# Patient Record
Sex: Female | Born: 1975 | Race: Black or African American | Hispanic: No | Marital: Single | Smoking: Current every day smoker
Health system: Southern US, Community
[De-identification: ages and names within clinical notes are randomized; demographics above are authoritative.]

## PROBLEM LIST (undated history)

## (undated) DIAGNOSIS — J45909 Unspecified asthma, uncomplicated: Secondary | ICD-10-CM

---

## 2016-05-23 ENCOUNTER — Emergency Department (HOSPITAL_COMMUNITY)
Admission: EM | Admit: 2016-05-23 | Discharge: 2016-05-23 | Disposition: A | Discharge status: Home / Self Care | Payer: Medicaid Other | Attending: Emergency Medicine | Admitting: Emergency Medicine

## 2016-05-23 ENCOUNTER — Encounter (HOSPITAL_COMMUNITY): Payer: Self-pay | Admitting: Emergency Medicine

## 2016-05-23 DIAGNOSIS — R062 Wheezing: Secondary | ICD-10-CM | POA: Diagnosis present

## 2016-05-23 DIAGNOSIS — J988 Other specified respiratory disorders: Secondary | ICD-10-CM | POA: Diagnosis not present

## 2016-05-23 DIAGNOSIS — J45909 Unspecified asthma, uncomplicated: Secondary | ICD-10-CM | POA: Diagnosis not present

## 2016-05-23 HISTORY — DX: Unspecified asthma, uncomplicated: J45.909

## 2016-05-23 LAB — RAPID STREP SCREEN (MED CTR MEBANE ONLY): Streptococcus, Group A Screen (Direct): NEGATIVE

## 2016-05-23 MED ORDER — NAPROXEN 500 MG PO TABS: 500.0000 mg | ORAL_TABLET | Freq: Two times a day (BID) | ORAL | 0 refills | Status: DC

## 2016-05-23 MED ORDER — BENZONATATE 100 MG PO CAPS: 100.0000 mg | ORAL_CAPSULE | Freq: Three times a day (TID) | ORAL | 0 refills | Status: DC

## 2016-05-23 NOTE — ED Provider Notes (Signed)
[facility]-EMER[facility]ENCY DEPT Provider Note   CSN: [id number] Arrival date & time: 05/23/16  1255     History   Chief Complaint Chief Complaint  Patient presents with  . Influenza    HPI Alexandra Weber is a 41 y.o. female.  Patient presents with four-day history of cough, scratchy throat, chills. She has history of asthma but has not needed her inhaler. She noted some wheezing yesterday, none today. No associated fevers, vomiting, or diarrhea. Her daughter was recently sick with strep throat. No urinary tract infection symptoms. Patient has been treating at home with TheraFlu without relief. Also ibuprofen. Onset of symptoms acute. Course is constant. Nothing makes symptoms worse.       Past Medical History:  Diagnosis Date  . Asthma     There are no active problems to display for this patient.   History reviewed. No pertinent surgical history.  OB History    No data available       Home Medications    Prior to Admission medications   Not on File    Family History No family history on file.  Social History Social History  Substance Use Topics  . Smoking status: Never Smoker  . Smokeless tobacco: Never Used  . Alcohol use Not on file     Allergies   Patient has no allergy information on record.   Review of Systems Review of Systems  Constitutional: Positive for chills. Negative for fatigue and fever.  HENT: Positive for congestion and sore throat. Negative for ear pain, rhinorrhea and sinus pressure.   Eyes: Negative for redness.  Respiratory: Positive for cough and wheezing.   Gastrointestinal: Negative for abdominal pain, diarrhea, nausea and vomiting.  Genitourinary: Negative for dysuria.  Musculoskeletal: Negative for myalgias and neck stiffness.  Skin: Negative for rash.  Neurological: Negative for headaches.  Hematological: Negative for adenopathy.     Physical Exam Updated Vital Signs BP (!) 123/93 (BP Location: Right Arm)   Pulse 89   Temp  98.4 F (36.9 C) (Oral)   Resp 16   SpO2 100%   Physical Exam  Constitutional: She appears well-developed and well-nourished.  HENT:  Head: Normocephalic and atraumatic.  Right Ear: Tympanic membrane, external ear and ear canal normal.  Left Ear: Tympanic membrane, external ear and ear canal normal.  Nose: Mucosal edema present. No rhinorrhea.  Mouth/Throat: Uvula is midline and mucous membranes are normal. Mucous membranes are not dry. No oral lesions. No trismus in the jaw. No uvula swelling. Posterior oropharyngeal erythema present. No oropharyngeal exudate, posterior oropharyngeal edema or tonsillar abscesses.  Eyes: Conjunctivae are normal. Right eye exhibits no discharge. Left eye exhibits no discharge.  Neck: Normal range of motion. Neck supple.  Cardiovascular: Normal rate, regular rhythm and normal heart sounds.   Pulmonary/Chest: Effort normal and breath sounds normal. No respiratory distress. She has no wheezes. She has no rales.  Abdominal: Soft. There is no tenderness.  Lymphadenopathy:    She has no cervical adenopathy.  Neurological: She is alert.  Skin: Skin is warm and dry.  Psychiatric: She has a normal mood and affect.  Nursing note and vitals reviewed.    ED Treatments / Results  Labs (all labs ordered are listed, but only abnormal results are displayed) Labs Reviewed  RAPID STREP [facility]CREEN (NOT [facility])  CULTURE, GROUP A STREP [facility])    Procedures Procedures (including critical care time)  Medications Ordered in ED Medications - No data to display   Initial Impression / Assessment  and Plan / ED Course  I have reviewed the triage vital signs and the nursing notes.  Pertinent labs & imaging results that were available during my care of the patient were reviewed by me and considered in my medical decision making (see chart for details).     Patient seen and examined. Plan strep test. If negative discharge home with symptomatic control.  Vital signs  reviewed and are as follows: BP (!) 123/93 (BP Location: Right Arm)   Pulse 89   Temp 98.4 F (36.9 C) (Oral)   Resp 16   SpO2 100%   2:19 PM Patient counseled on supportive care for viral URI and s/s to return including worsening symptoms, persistent fever, persistent vomiting, or if they have any other concerns. Urged to see PCP if symptoms persist for more than 3 days. Patient verbalizes understanding and agrees with plan.    Final Clinical Impressions(s) / ED Diagnoses   Final diagnoses:  Respiratory infection   Patient with symptoms consistent with a viral syndrome. Strep neg. Vitals are stable, no fever. No signs of dehydration. Lung exam normal, no signs of pneumonia. Supportive therapy indicated with return if symptoms worsen.     New Prescriptions Current Discharge Medication List    START taking these medications   Details  benzonatate (TESSALON) 100 MG capsule Take 1 capsule (100 mg total) by mouth every 8 (eight) hours. Qty: 15 capsule, Refills: 0    naproxen (NAPROSYN) 500 MG tablet Take 1 tablet (500 mg total) by mouth 2 (two) times daily. Qty: 20 tablet, Refills: 0         Renne Crigler, PA-C 05/23/16 1419    Canary Brim Tegeler, MD 05/24/16 1101

## 2016-05-23 NOTE — ED Triage Notes (Signed)
Pt states her daughter had strep last week and on Friday she started to feel bad and her she had chills, states she has a cough and  Her throat feels bad, itchy

## 2016-05-23 NOTE — Discharge Instructions (Signed)
Please read and follow all provided instructions.  Your diagnoses today include:  1. Respiratory infection     You appear to have an upper respiratory infection (URI). An upper respiratory tract infection, or cold, is a viral infection of the air passages leading to the lungs. It should improve gradually after 5-7 days. You may have a lingering cough that lasts for 2- 4 weeks after the infection.  Tests performed today include:  Vital signs. See below for your results today.   Strep test -  negative   Medications prescribed:   Tessalon Perles - cough suppressant medication   Naproxen - anti-inflammatory pain medication  Do not exceed 500mg  naproxen every 12 hours, take with food  You have been prescribed an anti-inflammatory medication or NSAID. Take with food. Take smallest effective dose for the shortest duration needed for your pain. Stop taking if you experience stomach pain or vomiting.   Take any prescribed medications only as directed. Treatment for your infection is aimed at treating the symptoms. There are no medications, such as antibiotics, that will cure your infection.   Home care instructions:  Follow any educational materials contained in this packet.   Your illness is contagious and can be spread to others, especially during the first 3 or 4 days. It cannot be cured by antibiotics or other medicines. Take basic precautions such as washing your hands often, covering your mouth when you cough or sneeze, and avoiding public places where you could spread your illness to others.   Please continue drinking plenty of fluids.  Use over-the-counter medicines as needed as directed on packaging for symptom relief.  You may also use ibuprofen or tylenol as directed on packaging for pain or fever.  Do not take multiple medicines containing Tylenol or acetaminophen to avoid taking too much of this medication.  Follow-up instructions: Please follow-up with your primary care  provider in the next 3 days for further evaluation of your symptoms if you are not feeling better.   Return instructions:   Please return to the Emergency Department if you experience worsening symptoms.   RETURN IMMEDIATELY IF you develop shortness of breath, confusion or altered mental status, a new rash, become dizzy, faint, or poorly responsive, or are unable to be cared for at home.  Please return if you have persistent vomiting and cannot keep down fluids or develop a fever that is not controlled by tylenol or motrin.    Please return if you have any other emergent concerns.  Additional Information:  Your vital signs today were: BP (!) 123/93 (BP Location: Right Arm)    Pulse 89    Temp 98.4 F (36.9 C) (Oral)    Resp 16    SpO2 100%  If your blood pressure (BP) was elevated above 135/85 this visit, please have this repeated by your doctor within one month. --------------

## 2016-05-23 NOTE — ED Notes (Signed)
Pt states he daughter had strep last week and Friday she started to feel bad, started  To have sorethroat on Friday and a cough today , it makes her chest hurt

## 2016-05-25 LAB — CULTURE, GROUP A STREP (THRC)

## 2016-06-25 ENCOUNTER — Emergency Department (HOSPITAL_COMMUNITY)
Admission: EM | Admit: 2016-06-25 | Discharge: 2016-06-25 | Disposition: A | Discharge status: Home / Self Care | Payer: Medicaid Other | Attending: Emergency Medicine | Admitting: Emergency Medicine

## 2016-06-25 ENCOUNTER — Encounter (HOSPITAL_COMMUNITY): Payer: Self-pay | Admitting: Emergency Medicine

## 2016-06-25 DIAGNOSIS — Y92003 Bedroom of unspecified non-institutional (private) residence as the place of occurrence of the external cause: Secondary | ICD-10-CM | POA: Diagnosis not present

## 2016-06-25 DIAGNOSIS — M25461 Effusion, right knee: Secondary | ICD-10-CM | POA: Insufficient documentation

## 2016-06-25 DIAGNOSIS — S8391XA Sprain of unspecified site of right knee, initial encounter: Secondary | ICD-10-CM | POA: Diagnosis not present

## 2016-06-25 DIAGNOSIS — Y999 Unspecified external cause status: Secondary | ICD-10-CM | POA: Insufficient documentation

## 2016-06-25 DIAGNOSIS — J45909 Unspecified asthma, uncomplicated: Secondary | ICD-10-CM | POA: Insufficient documentation

## 2016-06-25 DIAGNOSIS — F17210 Nicotine dependence, cigarettes, uncomplicated: Secondary | ICD-10-CM | POA: Diagnosis not present

## 2016-06-25 DIAGNOSIS — Y939 Activity, unspecified: Secondary | ICD-10-CM | POA: Diagnosis not present

## 2016-06-25 DIAGNOSIS — S8991XA Unspecified injury of right lower leg, initial encounter: Secondary | ICD-10-CM | POA: Diagnosis present

## 2016-06-25 DIAGNOSIS — W1830XA Fall on same level, unspecified, initial encounter: Secondary | ICD-10-CM | POA: Insufficient documentation

## 2016-06-25 MED ORDER — NAPROXEN 500 MG PO TABS: 500.0000 mg | ORAL_TABLET | Freq: Two times a day (BID) | ORAL | 0 refills | Status: DC

## 2016-06-25 NOTE — Discharge Instructions (Signed)
Please see the Orthopedist as requested for optimal evaluation and care of your knee injury.

## 2016-06-25 NOTE — ED Notes (Signed)
Ortho tech in. 

## 2016-06-25 NOTE — Progress Notes (Signed)
Orthopedic Tech Progress Note Patient Details:  Alexandra Weber 04/20/1975 [id number]  Ortho Devices Type of Ortho Device: Knee Immobilizer, Crutches Ortho Device/Splint Location: RLE Ortho Device/Splint Interventions: Ordered, Application   [name] 06/25/2016, 3:24 PM

## 2016-06-25 NOTE — ED Notes (Signed)
Ortho tech notified.  

## 2016-06-25 NOTE — ED Provider Notes (Signed)
[facility] DEPT Provider Note    By signing my name below, I, Earmon Phoenix, attest that this documentation has been prepared under the direction and in the presence of Derwood Kaplan, MD. Electronically Signed: Earmon Phoenix, ED Scribe. 06/25/16. 3:03 PM.    History   Chief Complaint Chief Complaint  Patient presents with  . Knee Pain    The history is provided by the patient and medical records. No language interpreter was used.    Alexandra Weber is a 41 y.o. female brought in by EMS who presents to the Emergency Department complaining of throbbing right knee pain that began two days ago. She reports associated worsening swelling. Pt reports hearing a pop when the pain began. She states she was playing around and twisted the knee. She has taken Ibuprofen, elevating and icing the area for pain with minimal relief. Bearing weight and moving the right ankle her intensifies her pain. She denies alleviating factors. She denies numbness, tingling or weakness of the RLE, bruising or wounds.   Past Medical History:  Diagnosis Date  . Asthma     There are no active problems to display for this patient.   History reviewed. No pertinent surgical history.  OB History    No data available       Home Medications    Prior to Admission medications   Medication Sig Start Date End Date Taking? Authorizing Provider  benzonatate (TESSALON) 100 MG capsule Take 1 capsule (100 mg total) by mouth every 8 (eight) hours. 05/23/16   Renne Crigler, PA-C  naproxen (NAPROSYN) 500 MG tablet Take 1 tablet (500 mg total) by mouth 2 (two) times daily. 06/25/16   Derwood Kaplan, MD    Family History No family history on file.  Social History Social History  Substance Use Topics  . Smoking status: Current Every Day Smoker  . Smokeless tobacco: Current User  . Alcohol use No     Allergies   Patient has no allergy information on record.   Review of Systems Review of Systems    Musculoskeletal: Positive for arthralgias and joint swelling.  Skin: Negative for color change and wound.  Neurological: Negative for weakness and numbness.     Physical Exam Updated Vital Signs BP (!) 156/92 (BP Location: Left Arm)   Pulse 92   Temp 97.9 F (36.6 C) (Oral)   Resp 12   Ht  (1.626 m)   Wt 110 lb (49.9 kg)   LMP 06/21/2016   SpO2 100%   BMI 18.88 kg/m   Physical Exam  Constitutional: She is oriented to person, place, and time. She appears well-developed and well-nourished.  HENT:  Head: Normocephalic.  Eyes: EOM are normal.  Neck: Normal range of motion.  Pulmonary/Chest: Effort normal.  Abdominal: She exhibits no distension.  Musculoskeletal: She exhibits tenderness.  Right knee with gross effusion appreciated to lateral and medial aspect. Pt is having difficulty extending the right leg secondary to pain. Tenderness to palpation over distal quadricept tendon, medial and lateral knee. No laxity to posterior and anterior drawers. Pt has tenderness with both valgus and varus movement.  Neurological: She is alert and oriented to person, place, and time.  Psychiatric: She has a normal mood and affect.  Nursing note and vitals reviewed.    ED Treatments / Results  DIAGNOSTIC STUDIES: Oxygen Saturation is 100% on RA, normal by my interpretation.   COORDINATION OF CARE: 2:59 PM- Will order crutches and a knee immobilizer. Will give referral to orthopedist.  Pt verbalizes understanding and agrees to plan.  Medications - No data to display  Labs (all labs ordered are listed, but only abnormal results are displayed) Labs Reviewed - No data to display  EKG  EKG Interpretation None       Radiology No results found.  Procedures Procedures (including critical care time)  Medications Ordered in ED Medications - No data to display   Initial Impression / Assessment and Plan / ED Course  I have reviewed the triage vital signs and the nursing  notes.  Pertinent labs & imaging results that were available during my care of the patient were reviewed by me and considered in my medical decision making (see chart for details).     Pt likely has soft tissue injury of the knee / knee sprain. Knee is stable. We will d/c with partial weight bearing knee immobilizer and ortho f/u.  Final Clinical Impressions(s) / ED Diagnoses   Final diagnoses:  Effusion of right knee  Sprain of right knee, unspecified ligament, initial encounter    New Prescriptions Discharge Medication List as of 06/25/2016  3:28 PM      I personally performed the services described in this documentation, which was scribed in my presence. The recorded information has been reviewed and is accurate.     Derwood Kaplan, MD 06/25/16 1640

## 2016-06-25 NOTE — ED Notes (Signed)
Twisted right knee 2 days ago. Has been treating with Ibuprofen, Tylenol and ice without relief.

## 2016-06-25 NOTE — ED Triage Notes (Signed)
Pt. stated I was in the bedroom and fell, and hurt my knees.

## 2016-10-19 ENCOUNTER — Emergency Department (HOSPITAL_COMMUNITY)
Admission: EM | Admit: 2016-10-19 | Discharge: 2016-10-19 | Disposition: A | Discharge status: Home / Self Care | Payer: Medicaid Other | Attending: Emergency Medicine | Admitting: Emergency Medicine

## 2016-10-19 ENCOUNTER — Emergency Department (HOSPITAL_COMMUNITY): Payer: Medicaid Other

## 2016-10-19 ENCOUNTER — Encounter (HOSPITAL_COMMUNITY): Payer: Self-pay | Admitting: Emergency Medicine

## 2016-10-19 DIAGNOSIS — J45909 Unspecified asthma, uncomplicated: Secondary | ICD-10-CM | POA: Insufficient documentation

## 2016-10-19 DIAGNOSIS — R0789 Other chest pain: Secondary | ICD-10-CM | POA: Insufficient documentation

## 2016-10-19 DIAGNOSIS — R918 Other nonspecific abnormal finding of lung field: Secondary | ICD-10-CM | POA: Insufficient documentation

## 2016-10-19 DIAGNOSIS — R072 Precordial pain: Secondary | ICD-10-CM

## 2016-10-19 DIAGNOSIS — F17200 Nicotine dependence, unspecified, uncomplicated: Secondary | ICD-10-CM | POA: Diagnosis not present

## 2016-10-19 LAB — BASIC METABOLIC PANEL
ANION GAP: 6 (ref 5–15)
BUN: 8 mg/dL (ref 6–20)
CO2: 25 mmol/L (ref 22–32)
CREATININE: 0.84 mg/dL (ref 0.44–1.00)
Calcium: 8.9 mg/dL (ref 8.9–10.3)
Chloride: 107 mmol/L (ref 101–111)
Glucose, Bld: 80 mg/dL (ref 65–99)
Potassium: 3.9 mmol/L (ref 3.5–5.1)
SODIUM: 138 mmol/L (ref 135–145)

## 2016-10-19 LAB — POCT I-STAT TROPONIN I: TROPONIN I, POC: 0 ng/mL (ref 0.00–0.08)

## 2016-10-19 LAB — CBC
HCT: 34.6 % — ABNORMAL LOW (ref 36.0–46.0)
HEMOGLOBIN: 11.4 g/dL — AB (ref 12.0–15.0)
MCH: 30.2 pg (ref 26.0–34.0)
MCHC: 32.9 g/dL (ref 30.0–36.0)
MCV: 91.5 fL (ref 78.0–100.0)
Platelets: 189 10*3/uL (ref 150–400)
RBC: 3.78 MIL/uL — AB (ref 3.87–5.11)
RDW: 13.7 % (ref 11.5–15.5)
WBC: 8.1 10*3/uL (ref 4.0–10.5)

## 2016-10-19 MED ORDER — AZITHROMYCIN 250 MG PO TABS: 250.0000 mg | ORAL_TABLET | Freq: Every day | ORAL | 0 refills | Status: DC

## 2016-10-19 MED ORDER — IOPAMIDOL (ISOVUE-300) INJECTION 61%
INTRAVENOUS | Status: AC
  Administered 2016-10-19: 75 mL
  Filled 2016-10-19: qty 75

## 2016-10-19 NOTE — ED Notes (Signed)
Pt had drawn in triage for labs:  Gold Lavender  Lt green  Dark green

## 2016-10-19 NOTE — ED Notes (Addendum)
Patient transported to CT 

## 2016-10-19 NOTE — ED Provider Notes (Signed)
Emergency Department Provider Note   I have reviewed the triage vital signs and the nursing notes.   HISTORY  Chief Complaint Chest Pain   HPI Alexandra Weber is a 41 y.o. female with PMH of asthma presents to the emergency department for evaluation of left-sided chest pain just since radiated to the right side over the past 2 days. No modifying factors. Patient is a smoker and quit 2 days ago when symptoms began. Denies any fevers or chills. In addition to her chest pain symptoms she's noticed some clear, bilateral nipple discharge is very unusual for her. She is tried over-the-counter medications with little relief in symptoms.   Past Medical History:  Diagnosis Date  . Asthma     There are no active problems to display for this patient.   History reviewed. No pertinent surgical history.  Current Outpatient Rx  . Order #: [id number] Class: Historical Med  . Order #: [id number] Class: Historical Med  . Order #: [id number] Class: Historical Med  . Order #: [id number] Class: Print  . Order #: [id number] Class: Print  . Order #: [id number] Class: Print  . Order #: [id number] Class: Historical Med    Allergies Patient has no known allergies.  History reviewed. No pertinent family history.  Social History Social History  Substance Use Topics  . Smoking status: Current Every Day Smoker  . Smokeless tobacco: Current User  . Alcohol use No    Review of Systems  Constitutional: No fever/chills Eyes: No visual changes. ENT: No sore throat. Cardiovascular: Positive chest pain. Respiratory: Denies shortness of breath. Gastrointestinal: No abdominal pain.  No nausea, no vomiting.  No diarrhea.  No constipation. Genitourinary: Negative for dysuria. Musculoskeletal: Negative for back pain. Skin: Negative for rash. Neurological: Negative for headaches, focal weakness or numbness.  10-point ROS otherwise negative.  ____________________________________________   PHYSICAL  EXAM:  VITAL SIGNS: ED Triage Vitals  Enc Vitals Group     BP 10/19/16 1216 (!) 141/93     Pulse Rate 10/19/16 1216 78     Resp 10/19/16 1216 18     Temp 10/19/16 1216 98 F (36.7 C)     Temp Source 10/19/16 1216 Oral     SpO2 10/19/16 1216 100 %     Pain Score 10/19/16 1212 8    Constitutional: Alert and oriented. Well appearing and in no acute distress. Eyes: Conjunctivae are normal. Head: Atraumatic. Nose: No congestion/rhinnorhea. Mouth/Throat: Mucous membranes are moist.  Oropharynx non-erythematous. Neck: No stridor. No meningismus. Palpable, tender posterior cervical lymph node on the right. No abscess or cellulitis.  Cardiovascular: Normal rate, regular rhythm. Good peripheral circulation. Grossly normal heart sounds.   Respiratory: Normal respiratory effort.  No retractions. Lungs CTAB. Gastrointestinal: Soft and nontender. No distention.  Musculoskeletal: No lower extremity tenderness nor edema. No gross deformities of extremities. Neurologic:  Normal speech and language. No gross focal neurologic deficits are appreciated.  Skin:  Skin is warm, dry and intact. No rash noted.  ____________________________________________   LABS (all labs ordered are listed, but only abnormal results are displayed)  Labs Reviewed  CBC - Abnormal; Notable for the following:       Result Value   RBC 3.78 (*)    Hemoglobin 11.4 (*)    HCT 34.6 (*)    All other components within normal limits  BASIC METABOLIC PANEL  I-STAT TROPONIN, ED  POCT I-STAT TROPONIN I   ____________________________________________  EKG   EKG Interpretation  Date/Time:  Thursday October 19 2016 12:15:26 EDT Ventricular Rate:  77 PR Interval:    QRS Duration: 69 QT Interval:  349 QTC Calculation: 395 R Axis:   78 Text Interpretation:  Sinus rhythm Baseline wander in lead(s) II III aVF V1 No STEMI.  Confirmed by Alona Bene [phone]) on 10/19/2016 2:38:19 PM        ____________________________________________  RADIOLOGY  Dg Chest 2 View  Result Date: 10/19/2016 CLINICAL DATA:  Anterior chest pain for 2 days, history of asthma, former smoking history EXAM: CHEST  2 VIEW COMPARISON:  None. FINDINGS: The lungs appear somewhat hyperaerated. Within the right upper lobe near the apex there is an irregularly marginated opacity which does not appear to represent the anterior right first costochondral junction. A right upper lobe lung lesion cannot be excluded measuring approximately 13 mm in diameter. CT of the chest with IV contrast media is recommended to assess further. Mediastinal and hilar contours are unremarkable. The heart is within normal limits in size. No bony abnormality is seen. IMPRESSION: 1. Suspect 13 mm irregularly marginated nodular lesion in the right upper lobe. Recommend CT of the chest with IV contrast media. 2. Slight hyper aeration. Electronically Signed   By: Dwyane Dee M.D.   On: 10/19/2016 13:41   Ct Chest W Contrast  Result Date: 10/19/2016 CLINICAL DATA:  Follow-up abnormal chest x-ray. Neck pain radiating into chest. EXAM: CT CHEST WITH CONTRAST TECHNIQUE: Multidetector CT imaging of the chest was performed during intravenous contrast administration. CONTRAST:  75mL ISOVUE-300 IOPAMIDOL (ISOVUE-300) INJECTION 61% COMPARISON:  None. FINDINGS: Cardiovascular: No significant vascular findings. Normal heart size. No pericardial effusion. Mediastinum/Nodes: No enlarged mediastinal, hilar, or axillary lymph nodes. Thyroid gland, trachea, and esophagus demonstrate no significant findings. Lungs/Pleura: Central airways are normal. No pneumothorax. Scattered irregular nodular densities are seen in the lungs most prominent in the apices, particularly on the right, but also involving the lower lobes. A representative nodule in the right apex on series 3, image 21 measures 12 x 7 mm. An irregular nodular density on image 29 measures 20 x 9 mm. A  nodule in the left base on series 3, image 56 measures up to 9 mm. No masses. Upper Abdomen: No acute abnormality. Musculoskeletal: No chest wall abnormality. No acute or significant osseous findings. IMPRESSION: 1. Multiple irregular nodules are seen in the lungs involving both upper and lower lobes but most prominent in the apices, right greater than left. The history of pain without shortness of breath and the appearance would argue against a typical infectious process. Neoplasm/malignancy is not excluded. Recommend pulmonary consultation. This patient should be followed with either a PET-CT or a three-month follow-up CT scan depending on level of clinical suspicion for neoplasm. Electronically Signed   By: Gerome Sam III M.D   On: 10/19/2016 15:31    ____________________________________________   PROCEDURES  Procedure(s) performed:   Procedures  None ____________________________________________   INITIAL IMPRESSION / ASSESSMENT AND PLAN / ED COURSE  Pertinent labs & imaging results that were available during my care of the patient were reviewed by me and considered in my medical decision making (see chart for details).  Patient presents with atypical chest pain that has been intermittent over the past 2 days. No suspicion for ACS or pulmonary embolism. Chest x-ray shows an irregular nodular lesion in the right upper lobe of the lung. Radiology recommends CT with contrast for further evaluation which I'll order. Very low suspicion for PE in this case.   03:50 PM Discussed the CT findings in detail with the patient  including concern for possible malignancy. Plan to treat with antibiotics for possible atypical infection but lower suspicion for this clinically. Patient does not have a primary care provider but does have insurance (medicaid). Discussed importance of close f/u and will give contact information for PCP and referral for Pulmonology. Patient with O2 Sat 82% on RA documented  but think this is in error. Patient breathing comfortable on RA during all my evaluations with O2 Sat 100%. Placed ambulatory referral to Pulmonology.   At this time, I do not feel there is any life-threatening condition present. I have reviewed and discussed all results (EKG, imaging, lab, urine as appropriate), exam findings with patient. I have reviewed nursing notes and appropriate previous records.  I feel the patient is safe to be discharged home without further emergent workup. Discussed usual and customary return precautions. Patient and family (if present) verbalize understanding and are comfortable with this plan.  Patient will follow-up with their primary care provider. If they do not have a primary care provider, information for follow-up has been provided to them. All questions have been answered.  ____________________________________________  FINAL CLINICAL IMPRESSION(S) / ED DIAGNOSES  Final diagnoses:  Precordial chest pain  Mass of upper lobe of right lung     MEDICATIONS GIVEN DURING THIS VISIT:  Medications  iopamidol (ISOVUE-300) 61 % injection (75 mLs  Contrast Given 10/19/16 1510)     NEW OUTPATIENT MEDICATIONS STARTED DURING THIS VISIT:  Discharge Medication List as of 10/19/2016  3:57 PM    START taking these medications   Details  azithromycin (ZITHROMAX) 250 MG tablet Take 1 tablet (250 mg total) by mouth daily. Take first 2 tablets together, then 1 every day until finished., Starting Thu 10/19/2016, Print          Note:  This document was prepared using Dragon voice recognition software and may include unintentional dictation errors.  Alona Bene, MD Emergency Medicine    Genice Kimberlin, Arlyss Repress, MD 10/19/16 [phone]

## 2016-10-19 NOTE — ED Triage Notes (Signed)
Pt states that she woke up with neck pain radiating into her chest. Alert and oriented. No SOB.

## 2016-10-19 NOTE — Discharge Instructions (Signed)
You were seen in the ED today with chest pain and neck pain. The x-ray and then CT scan of the chest showed a possible infection in the lungs but could not completely eliminate cancer a possibility. Like we discussed this will need quick evaluation but both a PCP and Pulmonologist. I have placed a referral to a Pulmonology group and provided the information for local primary care physicians. You should call the PCP offices today/tomorrow and schedule the soonest possible appointment. Radiology recommends either a PET scan or follow up CT of the chest in 3 months.   I am prescribing antibiotics in case this is an atypical infection.   Return to the ED with any new or worsening symptoms including chest pain, fever, chills, or difficulty breathing.

## 2016-11-02 ENCOUNTER — Telehealth: Payer: Self-pay | Admitting: Pulmonary Disease

## 2016-11-02 ENCOUNTER — Ambulatory Visit (INDEPENDENT_AMBULATORY_CARE_PROVIDER_SITE_OTHER): Payer: Medicaid Other | Admitting: Pulmonary Disease

## 2016-11-02 ENCOUNTER — Encounter: Payer: Self-pay | Admitting: Pulmonary Disease

## 2016-11-02 DIAGNOSIS — R918 Other nonspecific abnormal finding of lung field: Secondary | ICD-10-CM | POA: Diagnosis not present

## 2016-11-02 DIAGNOSIS — D649 Anemia, unspecified: Secondary | ICD-10-CM | POA: Insufficient documentation

## 2016-11-02 DIAGNOSIS — D500 Iron deficiency anemia secondary to blood loss (chronic): Secondary | ICD-10-CM

## 2016-11-02 DIAGNOSIS — Z720 Tobacco use: Secondary | ICD-10-CM | POA: Diagnosis not present

## 2016-11-02 DIAGNOSIS — Z23 Encounter for immunization: Secondary | ICD-10-CM | POA: Diagnosis not present

## 2016-11-02 NOTE — Assessment & Plan Note (Signed)
Referral to GYN for heavy periods and anemia and breast soreness

## 2016-11-02 NOTE — Assessment & Plan Note (Signed)
Smoking cessation was encouraged. Emphysema was also noted on the CT scan and a used images to motivate her to quit

## 2016-11-02 NOTE — Assessment & Plan Note (Signed)
nodules in both lung, largest in the right apex these may be infectious or inflammatory. Repeat CT chest without contrast in 2 months  You have to quit smoking!

## 2016-11-02 NOTE — Telephone Encounter (Signed)
During pt's OV with RA today, pt was instructed to f/u in 37mo. RA does not have any availability in 37mo.  RA please advise if okay to schedule with NP. Thanks.

## 2016-11-02 NOTE — Patient Instructions (Signed)
You have nodules in both lung, largest in the right apex these may be infectious or inflammatory. Repeat CT chest without contrast in 2 months  You have to quit smoking!  Referral to GYN for heavy periods and anemia

## 2016-11-02 NOTE — Telephone Encounter (Signed)
OK to double booked with me

## 2016-11-02 NOTE — Progress Notes (Signed)
   Subjective:    Patient ID: Alexandra Weber, female    DOB: 09/02/1975, 41 y.o.   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 She is not currently working since her license has expired. She developed sudden onset soreness in her breasts with expression of milk for 1-2 weeks and went to the ED on 09/2316. Chest x-ray showed right upper lobe nodule. CT chest was performed which showed several nodules in the right upper lobe and also in the left lung apex and left base-largest nodule was about 20 x 19 mm hence the referral. I reviewed these images with the patient She denies shortness of breath, wheezing. She reports a nodule in the back of her neck which was not able to palpate. She reports radiating pain down her right arm and soreness in her breasts. Her weight has fluctuated between 105 115 pounds and she is currently at 109 pounds. She reports loss of appetite. She denies fevers or night sweats. There is no history of sick contacts. She does report heavy periods with passing of blood clots Labs were reviewed and significant for hemoglobin of 11.4 MCV of 91 she lives with her children at [address] and [address] and she keeps 3 grandchildren. She smokes about a pack per week which has cut down from 2 packs per week She has history of emphysema and an uncle, and cervical cancer in her mother [id number]       Past Medical History:  Diagnosis Date  . Asthma     No past surgical history on file.   No Known Allergies  Social History   Social History  . Marital status: Single    Spouse name: N/A  . Number of children: N/A  . Years of education: N/A   Occupational History  . Not on file.   Social History Main Topics  . Smoking status: Current Every Day Smoker  . Smokeless tobacco: Never Used     Comment: states that she smokes 1pack per week  . Alcohol use No  . Drug use: Yes    Types: Marijuana     Comment: states that she quit 2 weeks ago  . Sexual activity: Not on file   Other Topics Concern  . Not on file   Social History Narrative  . No narrative on file      No family history on file.      Review of Systems   Positive for indigestion, headaches, nasal congestion  Constitutional: negative for anorexia, fevers and sweats  Eyes: negative for irritation, redness and visual disturbance  Ears, nose, mouth, throat, and face: negative for earaches, epistaxis, nasal congestion and sore throat  Respiratory: negative for cough, dyspnea on exertion, sputum and wheezing  Cardiovascular: negative for chest pain, dyspnea, lower extremity edema, orthopnea, palpitations and syncope  Gastrointestinal: negative for abdominal pain, constipation, diarrhea, melena, nausea and vomiting  Genitourinary:negative for dysuria, frequency and hematuria  Hematologic/lymphatic: negative for bleeding, easy bruising and lymphadenopathy  Musculoskeletal:negative for arthralgias, muscle weakness and stiff joints  Neurological: negative for coordination problems, gait problems, headaches and weakness  Endocrine: negative for diabetic symptoms including polydipsia, polyuria and weight loss     Objective:   Physical Exam        Assessment & Plan:

## 2016-11-06 NOTE — Telephone Encounter (Signed)
lmtcb x1 for pt. 

## 2016-11-07 NOTE — Telephone Encounter (Signed)
Spoke with pt. She has been scheduled to see RA. Nothing further was needed.

## 2016-12-08 ENCOUNTER — Ambulatory Visit: Payer: Medicaid Other | Admitting: Pulmonary Disease

## 2016-12-14 ENCOUNTER — Encounter (HOSPITAL_COMMUNITY): Payer: Self-pay | Admitting: Emergency Medicine

## 2016-12-14 ENCOUNTER — Ambulatory Visit (HOSPITAL_COMMUNITY)
Admission: EM | Admit: 2016-12-14 | Discharge: 2016-12-14 | Disposition: A | Discharge status: Home / Self Care | Payer: Medicaid Other | Attending: Emergency Medicine | Admitting: Emergency Medicine

## 2016-12-14 DIAGNOSIS — R102 Pelvic and perineal pain: Secondary | ICD-10-CM | POA: Insufficient documentation

## 2016-12-14 DIAGNOSIS — Z202 Contact with and (suspected) exposure to infections with a predominantly sexual mode of transmission: Secondary | ICD-10-CM | POA: Diagnosis not present

## 2016-12-14 DIAGNOSIS — R918 Other nonspecific abnormal finding of lung field: Secondary | ICD-10-CM | POA: Insufficient documentation

## 2016-12-14 DIAGNOSIS — Z79899 Other long term (current) drug therapy: Secondary | ICD-10-CM | POA: Diagnosis not present

## 2016-12-14 DIAGNOSIS — J45909 Unspecified asthma, uncomplicated: Secondary | ICD-10-CM | POA: Diagnosis not present

## 2016-12-14 DIAGNOSIS — N898 Other specified noninflammatory disorders of vagina: Secondary | ICD-10-CM | POA: Diagnosis not present

## 2016-12-14 DIAGNOSIS — F17200 Nicotine dependence, unspecified, uncomplicated: Secondary | ICD-10-CM | POA: Insufficient documentation

## 2016-12-14 DIAGNOSIS — D649 Anemia, unspecified: Secondary | ICD-10-CM | POA: Insufficient documentation

## 2016-12-14 MED ORDER — METRONIDAZOLE 500 MG PO TABS
500.0000 mg | ORAL_TABLET | Freq: Two times a day (BID) | ORAL | 0 refills | Status: DC
Start: 2016-12-14 — End: 2018-03-15

## 2016-12-14 MED ORDER — CEFTRIAXONE SODIUM 250 MG IJ SOLR
250.0000 mg | Freq: Once | INTRAMUSCULAR | Status: AC
  Administered 2016-12-14: 250 mg via INTRAMUSCULAR

## 2016-12-14 MED ORDER — DOXYCYCLINE HYCLATE 100 MG PO CAPS: 100.0000 mg | ORAL_CAPSULE | Freq: Two times a day (BID) | ORAL | 0 refills | Status: DC

## 2016-12-14 MED ORDER — CEFTRIAXONE SODIUM 250 MG IJ SOLR
INTRAMUSCULAR | Status: AC
Start: 2016-12-14 — End: 2016-12-14
  Filled 2016-12-14: qty 250

## 2016-12-14 MED ORDER — LIDOCAINE HCL (PF) 1 % IJ SOLN
INTRAMUSCULAR | Status: AC
  Filled 2016-12-14: qty 2

## 2016-12-14 NOTE — ED Triage Notes (Signed)
Pt c/o vaginal greenish discharged x2 days, was told to get checked for STDs because she had sexual relations with someone who told her they had gonnorhea, chlamydia, and trich.

## 2016-12-14 NOTE — Discharge Instructions (Signed)
Take the medication as directed. The medicine has been a scribe to your pharmacy. The antibiotics he received today should cover gonorrhea, chlamydia, Trichomonas and bacterial vaginosis.

## 2016-12-14 NOTE — ED Provider Notes (Signed)
[facility]    CSN: [id number] Arrival date & time: 12/14/16  1802     History   Chief Complaint Chief Complaint  Patient presents with  . Exposure to STD    HPI Alexandra Weber is a 41 y.o. female.   41 year old female states that she had sexual intercourse with a partner who had sexual intercourse with another party who states that she was recently diagnosed with gonorrhea, trichomonas and Chlamydia. Patient has had vaginal discharge, portion of it green with minor pelvic discomfort for one week.denies urinary symptoms. LMP the first week of September, right on time, none missed.      Past Medical History:  Diagnosis Date  . Asthma     Patient Active Problem List   Diagnosis Date Noted  . Pulmonary nodules 11/02/2016  . Anemia 11/02/2016  . Tobacco abuse 11/02/2016    Past Surgical History:  Procedure Laterality Date  . CESAREAN SECTION      OB History    No data available       Home Medications    Prior to Admission medications   Medication Sig Start Date End Date Taking? Authorizing Provider  acetaminophen (TYLENOL) 500 MG tablet Take 1,000 mg by mouth every 6 (six) hours as needed for moderate pain.    [provider]  doxycycline (VIBRAMYCIN) 100 MG capsule Take 1 capsule (100 mg total) by mouth 2 (two) times daily. 12/14/16   Hayden RasmussenMabe, Jagjit Riner, NP  metroNIDAZOLE (FLAGYL) 500 MG tablet Take 1 tablet (500 mg total) by mouth 2 (two) times daily. X 7 days 12/14/16   Hayden RasmussenMabe, Marcellus Pulliam, NP  sertraline (ZOLOFT) 25 MG tablet TAKE ONE TABLET BY MOUTH ONCE DAILY FOR 7 DAYS 10/09/16   [provider]  sertraline (ZOLOFT) 50 MG tablet Take 50 mg by mouth daily. 10/09/16   [provider]    Family History No family history on file.  Social History Social History  Substance Use Topics  . Smoking status: Current Every Day Smoker  . Smokeless tobacco: Never Used     Comment: states that she smokes 1pack per week  . Alcohol use No      Allergies   Patient has no known allergies.   Review of Systems Review of Systems  Constitutional: Negative.   Gastrointestinal: Negative.   Genitourinary: Positive for pelvic pain and vaginal discharge. Negative for dysuria and menstrual problem.  Neurological: Negative.   All other systems reviewed and are negative.    Physical Exam Triage Vital Signs ED Triage Vitals  Enc Vitals Group     BP 12/14/16 1834 129/82     Pulse Rate 12/14/16 1833 84     Resp 12/14/16 1833 18     Temp 12/14/16 1833 98.3 F (36.8 C)     Temp Source 12/14/16 1833 Oral     SpO2 12/14/16 1833 100 %     Weight --      Height --      Head Circumference --      Peak Flow --      Pain Score 12/14/16 1836 6     Pain Loc --      Pain Edu? --      Excl. in GC? --    No data found.   Updated Vital Signs BP 129/82   Pulse 84   Temp 98.3 F (36.8 C) (Oral)   Resp 18   LMP 11/27/2016   SpO2 100%   Visual Acuity Right Eye Distance:  Left Eye Distance:   Bilateral Distance:    Right Eye Near:   Left Eye Near:    Bilateral Near:     Physical Exam  Constitutional: She appears well-developed and well-nourished. No distress.  HENT:  Head: Normocephalic and atraumatic.  Neck: Neck supple.  Pulmonary/Chest: Effort normal.  Neurological: She is alert.  Skin: Skin is warm and dry.  Psychiatric: She has a normal mood and affect.  Nursing note and vitals reviewed.    UC Treatments / Results  Labs (all labs ordered are listed, but only abnormal results are displayed) Labs Reviewed  CERVICOVAGINAL ANCILLARY ONLY    EKG  EKG Interpretation None       Radiology No results found.  Procedures Procedures (including critical care time)  Medications Ordered in UC Medications  cefTRIAXone (ROCEPHIN) injection 250 mg (not administered)     Initial Impression / Assessment and Plan / UC Course  I have reviewed the triage vital signs and the nursing notes.  Pertinent labs  & imaging results that were available during my care of the patient were reviewed by me and considered in my medical decision making (see chart for details).    Take the medication as directed. The medicine has been a scribe to your pharmacy. The antibiotics he received today should cover gonorrhea, chlamydia, Trichomonas and bacterial vaginosis.   Pt self collected vag swab after instructions.  Final Clinical Impressions(s) / UC Diagnoses   Final diagnoses:  Exposure to STD  Vaginal discharge  Pelvic pain in female    New Prescriptions New Prescriptions   DOXYCYCLINE (VIBRAMYCIN) 100 MG CAPSULE    Take 1 capsule (100 mg total) by mouth 2 (two) times daily.   METRONIDAZOLE (FLAGYL) 500 MG TABLET    Take 1 tablet (500 mg total) by mouth 2 (two) times daily. X 7 days     Controlled Substance Prescriptions [place] Controlled Substance Registry consulted? Not Applicable   Hayden Rasmussen, NP 12/14/16 [phone]

## 2016-12-15 LAB — CERVICOVAGINAL ANCILLARY ONLY
Bacterial vaginitis: POSITIVE — AB
CANDIDA VAGINITIS: NEGATIVE
Chlamydia: NEGATIVE
Neisseria Gonorrhea: NEGATIVE
Trichomonas: NEGATIVE

## 2017-01-03 ENCOUNTER — Inpatient Hospital Stay: Admission: RE | Admit: 2017-01-03 | Payer: Medicaid Other | Source: Ambulatory Visit

## 2017-01-10 ENCOUNTER — Ambulatory Visit: Payer: Medicaid Other | Admitting: Obstetrics and Gynecology

## 2017-02-05 ENCOUNTER — Ambulatory Visit: Payer: Medicaid Other | Admitting: Obstetrics and Gynecology

## 2017-02-08 ENCOUNTER — Emergency Department (HOSPITAL_COMMUNITY): Payer: Self-pay

## 2017-02-08 ENCOUNTER — Encounter (HOSPITAL_COMMUNITY): Payer: Self-pay | Admitting: Emergency Medicine

## 2017-02-08 ENCOUNTER — Emergency Department (HOSPITAL_COMMUNITY)
Admission: EM | Admit: 2017-02-08 | Discharge: 2017-02-08 | Disposition: A | Discharge status: Home / Self Care | Payer: Self-pay | Attending: Emergency Medicine | Admitting: Emergency Medicine

## 2017-02-08 DIAGNOSIS — W000XXA Fall on same level due to ice and snow, initial encounter: Secondary | ICD-10-CM | POA: Insufficient documentation

## 2017-02-08 DIAGNOSIS — W19XXXA Unspecified fall, initial encounter: Secondary | ICD-10-CM

## 2017-02-08 DIAGNOSIS — Z79899 Other long term (current) drug therapy: Secondary | ICD-10-CM | POA: Insufficient documentation

## 2017-02-08 DIAGNOSIS — Y929 Unspecified place or not applicable: Secondary | ICD-10-CM | POA: Insufficient documentation

## 2017-02-08 DIAGNOSIS — S4992XA Unspecified injury of left shoulder and upper arm, initial encounter: Secondary | ICD-10-CM

## 2017-02-08 DIAGNOSIS — M25521 Pain in right elbow: Secondary | ICD-10-CM | POA: Insufficient documentation

## 2017-02-08 DIAGNOSIS — J45909 Unspecified asthma, uncomplicated: Secondary | ICD-10-CM | POA: Insufficient documentation

## 2017-02-08 DIAGNOSIS — S4991XA Unspecified injury of right shoulder and upper arm, initial encounter: Secondary | ICD-10-CM

## 2017-02-08 DIAGNOSIS — M25529 Pain in unspecified elbow: Secondary | ICD-10-CM

## 2017-02-08 DIAGNOSIS — F17210 Nicotine dependence, cigarettes, uncomplicated: Secondary | ICD-10-CM | POA: Insufficient documentation

## 2017-02-08 DIAGNOSIS — Y999 Unspecified external cause status: Secondary | ICD-10-CM | POA: Insufficient documentation

## 2017-02-08 DIAGNOSIS — Y9301 Activity, walking, marching and hiking: Secondary | ICD-10-CM | POA: Insufficient documentation

## 2017-02-08 MED ORDER — ACETAMINOPHEN 325 MG PO TABS
650.0000 mg | ORAL_TABLET | Freq: Once | ORAL | Status: AC
  Administered 2017-02-08: 650 mg via ORAL
  Filled 2017-02-08: qty 2

## 2017-02-08 MED ORDER — OXYCODONE-ACETAMINOPHEN 5-325 MG PO TABS
1.0000 | ORAL_TABLET | Freq: Once | ORAL | Status: AC
  Administered 2017-02-08: 1 via ORAL
  Filled 2017-02-08: qty 1

## 2017-02-08 NOTE — Discharge Instructions (Signed)
Your shoulder x-ray was normal. Your pain is likely from a soft tissue injury. This is initially treated with conservative management including tylenol, ibuprofen, rest, ice and slow range of motion exercises. Wear your sling and rest for 2-3 days, after start to flex and extend your joint to avoid stiffness.   Your elbow x-ray should a slight "bony irregularity". This could be a subtle chip off your bone or it could also be normal, not new, but just noticed on x-ray. This is treated conservatively as above with tylenol, ibuprofen, rest, ice and sling. If your pain does not improve in 7-10 days, please follow up with orthopedist for re-evaluation.

## 2017-02-08 NOTE — ED Triage Notes (Signed)
[place] [employer] [place].

## 2017-02-08 NOTE — ED Notes (Signed)
Bed: WTR5 Expected date:  Expected time:  Means of arrival:  Comments: 

## 2017-02-08 NOTE — ED Provider Notes (Signed)
[facility]   CSN: [id number] Arrival date & time: 02/08/17  [id number]     History   Chief Complaint Chief Complaint  Patient presents with  . Shoulder Injury  . Fall    HPI Alexandra Weber is a 41 y.o. female with no significant past medical history presents for evaluation of acute, gradually worsening right upper extremity pain since yesterday onset after mechanical fall. Patient was walking in parking lot of [employer] and slipped on ice falling backwards, landing on her right upper extremity. Her pain is localized to her right posterior elbow and radiates up to her right shoulder and right scapula. Also states her muscles feel tight on this extremity. Pain is worse with movement and direct palpation. Penis alleviated mildly with Tylenol. She denies head trauma or LOC during the fall. She denies prodromal symptoms of dizziness, chest pain, shortness of breath, palpitations. No anticoagulants. She is right-handed. She denies numbness or tingling distally. No previous injuries to this extremity.  Associated swelling, bleeding, skin color changes, tingling, numbness distally.  HPI  Past Medical History:  Diagnosis Date  . Asthma     Patient Active Problem List   Diagnosis Date Noted  . Pulmonary nodules 11/02/2016  . Anemia 11/02/2016  . Tobacco abuse 11/02/2016    Past Surgical History:  Procedure Laterality Date  . CESAREAN SECTION      OB History    No data available       Home Medications    Prior to Admission medications   Medication Sig Start Date End Date Taking? Authorizing Provider  acetaminophen (TYLENOL) 500 MG tablet Take 1,000 mg by mouth every 6 (six) hours as needed for moderate pain.    [provider]  doxycycline (VIBRAMYCIN) 100 MG capsule Take 1 capsule (100 mg total) by mouth 2 (two) times daily. 12/14/16   Hayden RasmussenMabe, David, NP  metroNIDAZOLE (FLAGYL) 500 MG tablet Take 1 tablet (500 mg total) by mouth  2 (two) times daily. X 7 days 12/14/16   Hayden RasmussenMabe, David, NP  sertraline (ZOLOFT) 25 MG tablet TAKE ONE TABLET BY MOUTH ONCE DAILY FOR 7 DAYS 10/09/16   [provider]  sertraline (ZOLOFT) 50 MG tablet Take 50 mg by mouth daily. 10/09/16   [provider]    Family History No family history on file.  Social History Social History   Tobacco Use  . Smoking status: Current Every Day Smoker  . Smokeless tobacco: Never Used  . Tobacco comment: states that she smokes 1pack per week  Substance Use Topics  . Alcohol use: No  . Drug use: Yes    Types: Marijuana    Comment: states that she quit 2 weeks ago     Allergies   Patient has no known allergies.   Review of Systems Review of Systems  Musculoskeletal: Positive for arthralgias and myalgias.  All other systems reviewed and are negative.    Physical Exam Updated Vital Signs BP (!) 130/93 (BP Location: Left Arm)   Pulse 81   Temp 97.8 F (36.6 C) (Oral)   Resp 16   Ht 5\' 4"  (1.626 m)   Wt 47.6 kg (105 lb)   LMP 01/27/2017   SpO2 100%   BMI 18.02 kg/m   Physical Exam  Constitutional: She is oriented to person, place, and time. She appears well-developed and well-nourished. No distress.  NAD.  HENT:  Head: Normocephalic and atraumatic.  Right Ear: External ear normal.  Left Ear: External  ear normal.  Nose: Nose normal.  Eyes: Conjunctivae and EOM are normal. No scleral icterus.  Neck: Normal range of motion.  Cardiovascular: Normal rate.  2+ radial pulses bilaterally <2 cap refill to fingers bilaterally  Pulmonary/Chest: Effort normal.  Musculoskeletal: Normal range of motion. She exhibits tenderness. She exhibits no deformity.  Focal tenderness to right olecranon and bilateral epicondyles w/o obvious edema, deformity, skin injury or crepitus. She has full PROM of right elbow with mild pain, able to supinate and pronate w/o pain.   Focal tenderness with very light touch to right scapula, deltoid,  and biceps grove. Mild edema to anterior biceps/biceps groove. Full PROM of right shoulder with pain, no crepitus. Normal appearing right bicep w/o shortening.   Neurological: She is alert and oriented to person, place, and time.  Sensation to light touch intact in bilateral UE  Skin: Skin is warm and dry. Capillary refill takes less than 2 seconds.  Psychiatric: She has a normal mood and affect. Her behavior is normal. Judgment and thought content normal.  Nursing note and vitals reviewed.    ED Treatments / Results  Labs (all labs ordered are listed, but only abnormal results are displayed) Labs Reviewed - No data to display  EKG  EKG Interpretation None       Radiology Dg Shoulder Right  Result Date: 02/08/2017 CLINICAL DATA:  Slip and fall with right shoulder pain, initial encounter EXAM: RIGHT SHOULDER - 2+ VIEW COMPARISON:  None. FINDINGS: No acute fracture or dislocation is noted. Well corticated bony density is noted along the inferior aspect of the glenohumeral articulation likely related to loose body. The underlying bony thorax is within normal limits. No soft tissue changes are seen. IMPRESSION: Chronic changes without acute abnormality. Electronically Signed   By: Alcide CleverMark  Lukens M.D.   On: 02/08/2017 10:51   Dg Elbow Complete Right  Result Date: 02/08/2017 CLINICAL DATA:  The patient slipped and fell in a parking lot yesterday landing on the right elbow and right shoulder. Persistent pain in these regions. EXAM: RIGHT ELBOW - COMPLETE 3+ VIEW COMPARISON:  None in PACs FINDINGS: The bones are subjectively adequately mineralized. The radial head and the olecranon are intact. There is subtle irregularity of the lateral epicondylar region but no surrounding infusion. The medial condyles and epicondylar regions are normal. IMPRESSION: No definite acute abnormality of the elbow. Subtle irregularity over the medial epicondylar region is noted. Correlation with any symptoms in this  region would be useful. Electronically Signed   By: David  Swazil[place]Jordan M.D.   On: 02/08/2017 11:35    Procedures Procedures (including critical care time)  Medications Ordered in ED Medications  oxyCODONE-acetaminophen (PERCOCET/ROXICET) 5-325 MG per tablet 1 tablet (1 tablet Oral Given 02/08/17 1152)  acetaminophen (TYLENOL) tablet 650 mg (650 mg Oral Given 02/08/17 1151)     Initial Impression / Assessment and Plan / ED Course  I have reviewed the triage vital signs and the nursing notes.  Pertinent labs & imaging results that were available during my care of the patient were reviewed by me and considered in my medical decision making (see chart for details).  Clinical Course as of Feb 08 1225  Thu Feb 08, 2017  1222 [facility] [place] [facility]; correction: medial irregularity He will addend.  [CG]    Clinical Course User Index [CG] Liberty HandyGibbons, Aubria Vanecek J, PA-C    41 yo female with traumatic right elbow and shoulder pain. Exam is reassuring, extremity is NVI. She has  focal tenderness basically everywhere in RUE with light touch w/o obvious deformity, edema, crepitus. Full PROM with some pain. X-rays are reassuring. Typo on elbow x-ray changed by radiologist. Pt had focal tenderness over lateral epicondyle not medially. Will d/c with conservative management and f/u with ortho if symptoms do not improve. Discussed return precautions. Pt verbalized understanding and agreeable with tx.  Final Clinical Impressions(s) / ED Diagnoses   Final diagnoses:  Fall, initial encounter  Localized pain of elbow joint  Soft tissue injury of left shoulder, initial encounter  Soft tissue injury of right shoulder, initial encounter    ED Discharge Orders    None       Liberty Handy, PA-C 02/08/17 1226    Melene Plan, DO 02/08/17 1502

## 2017-06-01 ENCOUNTER — Emergency Department (HOSPITAL_COMMUNITY): Payer: Self-pay

## 2017-06-01 ENCOUNTER — Emergency Department (HOSPITAL_COMMUNITY)
Admission: EM | Admit: 2017-06-01 | Discharge: 2017-06-01 | Disposition: A | Discharge status: Home / Self Care | Payer: Self-pay | Attending: Physician Assistant | Admitting: Physician Assistant

## 2017-06-01 ENCOUNTER — Encounter (HOSPITAL_COMMUNITY): Payer: Self-pay | Admitting: Emergency Medicine

## 2017-06-01 DIAGNOSIS — G8921 Chronic pain due to trauma: Secondary | ICD-10-CM | POA: Insufficient documentation

## 2017-06-01 DIAGNOSIS — W19XXXA Unspecified fall, initial encounter: Secondary | ICD-10-CM

## 2017-06-01 DIAGNOSIS — F17210 Nicotine dependence, cigarettes, uncomplicated: Secondary | ICD-10-CM | POA: Insufficient documentation

## 2017-06-01 DIAGNOSIS — M24011 Loose body in right shoulder: Secondary | ICD-10-CM

## 2017-06-01 DIAGNOSIS — M25511 Pain in right shoulder: Secondary | ICD-10-CM

## 2017-06-01 MED ORDER — MELOXICAM 15 MG PO TABS: 15.0000 mg | ORAL_TABLET | Freq: Every day | ORAL | 0 refills | Status: DC

## 2017-06-01 MED ORDER — ACETAMINOPHEN 500 MG PO TABS
1000.0000 mg | ORAL_TABLET | Freq: Once | ORAL | Status: AC
  Administered 2017-06-01: 1000 mg via ORAL
  Filled 2017-06-01: qty 2

## 2017-06-01 NOTE — Discharge Instructions (Signed)
Contact a health care provider if: Your pain gets worse. Your pain is not relieved with medicines. New pain develops in your arm, hand, or fingers. Get help right away if: Your arm, hand, or fingers: Tingle. Become numb. Become swollen. Become painful. Turn white or blu

## 2017-06-01 NOTE — ED Notes (Signed)
Pt c/o shoulder pain and headache

## 2017-06-01 NOTE — ED Provider Notes (Signed)
[facility]-EMERGENCY DEPT Provider Note   CSN: [id number] Arrival date & time: 06/01/17  1038     History   Chief Complaint Chief Complaint  Patient presents with  . Shoulder Pain    HPI Alexandra Weber is a 42 y.o. female who presents the emergency department chief complaint of right shoulder pain.  The patient was seen about 1 month ago after a fall in a parking lot and had soft tissue injury of the right elbow and right shoulder.  Patient states that she has had some difficulty using the arm but has been able to function well using Motrin.  About 2 weeks ago she noticed swelling on the anterior surface of her right arm.  She has had progressively worsening pain without paresthesia or weakness.  She has not been able to use the arm much because of pain in the front of the shoulder.  It is aching, worse with any movement or palpation.  She still has some pain in the right elbow.  Pain is better when she holds her arm in the position that a sling would hold it.  Patient lost her job because she was unable to perform her work duties after her fall.  Is been unable to follow-up with an orthopedist.  She rates her pain at 6-8 out of 10 depending on what she is doing with the arm.  HPI  Past Medical History:  Diagnosis Date  . Asthma     Patient Active Problem List   Diagnosis Date Noted  . Pulmonary nodules 11/02/2016  . Anemia 11/02/2016  . Tobacco abuse 11/02/2016    Past Surgical History:  Procedure Laterality Date  . CESAREAN SECTION       OB History   None      Home Medications    Prior to Admission medications   Medication Sig Start Date End Date Taking? Authorizing Provider  acetaminophen (TYLENOL) 500 MG tablet Take 1,000 mg by mouth every 6 (six) hours as needed for moderate pain.    [provider]  doxycycline (VIBRAMYCIN) 100 MG capsule Take 1 capsule (100 mg total) by mouth 2 (two) times daily. 12/14/16   Hayden RasmussenMabe, David, NP    metroNIDAZOLE (FLAGYL) 500 MG tablet Take 1 tablet (500 mg total) by mouth 2 (two) times daily. X 7 days 12/14/16   Hayden RasmussenMabe, David, NP  sertraline (ZOLOFT) 25 MG tablet TAKE ONE TABLET BY MOUTH ONCE DAILY FOR 7 DAYS 10/09/16   [provider]  sertraline (ZOLOFT) 50 MG tablet Take 50 mg by mouth daily. 10/09/16   [provider]    Family History No family history on file.  Social History Social History   Tobacco Use  . Smoking status: Current Every Day Smoker  . Smokeless tobacco: Never Used  . Tobacco comment: states that she smokes 1pack per week  Substance Use Topics  . Alcohol use: No  . Drug use: Yes    Types: Marijuana    Comment: states that she quit 2 weeks ago     Allergies   Patient has no known allergies.   Review of Systems Review of Systems Ten systems reviewed and are negative for acute change, except as noted in the HPI.    Physical Exam Updated Vital Signs BP 140/90 (BP Location: Right Arm)   Pulse 77   Temp 98 F (36.7 C) (Oral)   Resp 18   LMP 06/01/2017   SpO2 100%   Physical Exam Physical Exam  Nursing note and vitals reviewed. Constitutional: She is oriented to person, place, and time. She appears well-developed and well-nourished. No distress.  HENT:  Head: Normocephalic and atraumatic.  Eyes: Conjunctivae normal and EOM are normal. Pupils are equal, round, and reactive to light. No scleral icterus.  Neck: Normal range of motion.  Cardiovascular: Normal rate, regular rhythm and normal heart sounds.  Exam reveals no gallop and no friction rub.   No murmur heard. Pulmonary/Chest: Effort normal and breath sounds normal. No respiratory distress.  Abdominal: Soft. Bowel sounds are normal. She exhibits no distension and no mass. There is no tenderness. There is no guarding.  Neurological: She is alert and oriented to person, place, and time.  Skill skeletal.  Swelling over the long head of the right biceps tendon.  Exquisitely  tender to palpation.  Mildly swollen comparatively to the left.  Pain with flexion abduction internal/external rotation and to touch.  Range of motion is markedly limited.  Pain in the shoulder with flexion extension of the elbow.  Mild tenderness to palpation in that region.  Normal pulses, negative Hoffmann sign. Skin: Skin is warm and dry. She is not diaphoretic.     ED Treatments / Results  Labs (all labs ordered are listed, but only abnormal results are displayed) Labs Reviewed - No data to display  EKG None  Radiology No results found.  Procedures Procedures (including critical care time)  Medications Ordered in ED Medications - No data to display   Initial Impression / Assessment and Plan / ED Course  I have reviewed the triage vital signs and the nursing notes.  Pertinent labs & imaging results that were available during my care of the patient were reviewed by me and considered in my medical decision making (see chart for details).     Patient X-Ray negative for obvious fracture or dislocation. Pain managed in ED. Pt advised to follow up with orthopedics if symptoms persist for possibility of missed fracture diagnosis. Patient given brace while in ED, conservative therapy recommended and discussed. Patient will be dc home & is agreeable with above plan.   Final Clinical Impressions(s) / ED Diagnoses   Final diagnoses:  None    ED Discharge Orders    None       Arthor Captain, PA-C 06/01/17 1520    Mackuen, Cindee Salt, MD 06/01/17 1621

## 2017-06-01 NOTE — ED Triage Notes (Signed)
Per pt, states she injured her right shoulder in December-was treated at the ED-states she has not followed up with anyone

## 2017-09-29 ENCOUNTER — Encounter (HOSPITAL_COMMUNITY): Payer: Self-pay | Admitting: *Deleted

## 2017-09-29 ENCOUNTER — Other Ambulatory Visit: Payer: Self-pay

## 2017-09-29 ENCOUNTER — Emergency Department (HOSPITAL_COMMUNITY)
Admission: EM | Admit: 2017-09-29 | Discharge: 2017-09-29 | Disposition: A | Discharge status: Home / Self Care | Payer: Self-pay | Attending: Emergency Medicine | Admitting: Emergency Medicine

## 2017-09-29 DIAGNOSIS — K5900 Constipation, unspecified: Secondary | ICD-10-CM | POA: Insufficient documentation

## 2017-09-29 DIAGNOSIS — K649 Unspecified hemorrhoids: Secondary | ICD-10-CM | POA: Insufficient documentation

## 2017-09-29 DIAGNOSIS — K6289 Other specified diseases of anus and rectum: Secondary | ICD-10-CM | POA: Insufficient documentation

## 2017-09-29 DIAGNOSIS — Z5321 Procedure and treatment not carried out due to patient leaving prior to being seen by health care provider: Secondary | ICD-10-CM | POA: Insufficient documentation

## 2017-09-29 NOTE — ED Triage Notes (Signed)
Pt reports she has had 3-4 days of constipation, but did have some loose stool today. C/o rectal pain from hemorrhoids. Denies any blood in stool.

## 2017-10-01 NOTE — ED Notes (Signed)
Follow up call made  Pt states is returning to ed for same problem todat  10/01/17  1142  s Cree Napoli rn

## 2018-03-14 ENCOUNTER — Other Ambulatory Visit: Payer: Self-pay

## 2018-03-14 ENCOUNTER — Encounter (HOSPITAL_COMMUNITY): Payer: Self-pay

## 2018-03-14 DIAGNOSIS — F17210 Nicotine dependence, cigarettes, uncomplicated: Secondary | ICD-10-CM | POA: Insufficient documentation

## 2018-03-14 DIAGNOSIS — J45909 Unspecified asthma, uncomplicated: Secondary | ICD-10-CM | POA: Insufficient documentation

## 2018-03-14 DIAGNOSIS — R1032 Left lower quadrant pain: Secondary | ICD-10-CM | POA: Insufficient documentation

## 2018-03-14 LAB — COMPREHENSIVE METABOLIC PANEL
ALK PHOS: 63 U/L (ref 38–126)
ALT: 21 U/L (ref 0–44)
AST: 24 U/L (ref 15–41)
Albumin: 4.1 g/dL (ref 3.5–5.0)
Anion gap: 9 (ref 5–15)
BILIRUBIN TOTAL: 0.3 mg/dL (ref 0.3–1.2)
BUN: 10 mg/dL (ref 6–20)
CALCIUM: 9.2 mg/dL (ref 8.9–10.3)
CHLORIDE: 103 mmol/L (ref 98–111)
CO2: 26 mmol/L (ref 22–32)
CREATININE: 0.69 mg/dL (ref 0.44–1.00)
Glucose, Bld: 81 mg/dL (ref 70–99)
Potassium: 3.9 mmol/L (ref 3.5–5.1)
Sodium: 138 mmol/L (ref 135–145)
Total Protein: 7.4 g/dL (ref 6.5–8.1)

## 2018-03-14 LAB — CBC
HCT: 38.3 % (ref 36.0–46.0)
Hemoglobin: 12 g/dL (ref 12.0–15.0)
MCH: 30.6 pg (ref 26.0–34.0)
MCHC: 31.3 g/dL (ref 30.0–36.0)
MCV: 97.7 fL (ref 80.0–100.0)
NRBC: 0 % (ref 0.0–0.2)
PLATELETS: 209 10*3/uL (ref 150–400)
RBC: 3.92 MIL/uL (ref 3.87–5.11)
RDW: 13.1 % (ref 11.5–15.5)
WBC: 7.3 10*3/uL (ref 4.0–10.5)

## 2018-03-14 LAB — I-STAT BETA HCG BLOOD, ED (MC, WL, AP ONLY): I-stat hCG, quantitative: <5 m[IU]/mL (ref <5)

## 2018-03-14 LAB — URINALYSIS, ROUTINE W REFLEX MICROSCOPIC
Bilirubin Urine: NEGATIVE
GLUCOSE, UA: NEGATIVE mg/dL
Ketones, ur: NEGATIVE mg/dL
NITRITE: NEGATIVE
PH: 6 (ref 5.0–8.0)
Protein, ur: NEGATIVE mg/dL
SPECIFIC GRAVITY, URINE: 1.011 (ref 1.005–1.030)

## 2018-03-14 LAB — LIPASE, BLOOD: Lipase: 36 U/L (ref 11–51)

## 2018-03-14 MED ORDER — SODIUM CHLORIDE 0.9% FLUSH: 3.0000 mL | Freq: Once | INTRAVENOUS | Status: DC

## 2018-03-14 NOTE — ED Triage Notes (Signed)
Pt reports LLQ abdominal pain that radiates around to her L flank. Symptoms have persisted for 5 days. She denies vomiting or diarrhea. Endorses some nausea. Also reports bloating. A&Ox4. Ambulatory.

## 2018-03-15 ENCOUNTER — Emergency Department (HOSPITAL_COMMUNITY): Payer: Self-pay

## 2018-03-15 ENCOUNTER — Emergency Department (HOSPITAL_COMMUNITY)
Admission: EM | Admit: 2018-03-15 | Discharge: 2018-03-15 | Disposition: A | Discharge status: Home / Self Care | Payer: Self-pay | Attending: Emergency Medicine | Admitting: Emergency Medicine

## 2018-03-15 ENCOUNTER — Encounter (HOSPITAL_COMMUNITY): Payer: Self-pay

## 2018-03-15 DIAGNOSIS — R1032 Left lower quadrant pain: Secondary | ICD-10-CM

## 2018-03-15 LAB — GC/CHLAMYDIA PROBE AMP (~~LOC~~) NOT AT ARMC
Chlamydia: NEGATIVE
Neisseria Gonorrhea: NEGATIVE

## 2018-03-15 MED ORDER — IOPAMIDOL (ISOVUE-300) INJECTION 61%
100.0000 mL | Freq: Once | INTRAVENOUS | Status: AC | PRN
  Administered 2018-03-15: 100 mL via INTRAVENOUS

## 2018-03-15 MED ORDER — HYDROCODONE-ACETAMINOPHEN 5-325 MG PO TABS: 1.0000 | ORAL_TABLET | ORAL | 0 refills | Status: AC | PRN

## 2018-03-15 MED ORDER — IOHEXOL 300 MG/ML  SOLN
30.0000 mL | Freq: Once | INTRAMUSCULAR | Status: AC | PRN
  Administered 2018-03-15: 30 mL via ORAL

## 2018-03-15 MED ORDER — SODIUM CHLORIDE (PF) 0.9 % IJ SOLN
INTRAMUSCULAR | Status: AC
  Filled 2018-03-15: qty 50

## 2018-03-15 MED ORDER — FENTANYL CITRATE (PF) 100 MCG/2ML IJ SOLN
50.0000 ug | Freq: Once | INTRAMUSCULAR | Status: AC
  Administered 2018-03-15: 50 ug via INTRAVENOUS
  Filled 2018-03-15: qty 2

## 2018-03-15 MED ORDER — ONDANSETRON HCL 4 MG/2ML IJ SOLN
4.0000 mg | Freq: Once | INTRAMUSCULAR | Status: AC
  Administered 2018-03-15: 4 mg via INTRAVENOUS
  Filled 2018-03-15: qty 2

## 2018-03-15 MED ORDER — IOPAMIDOL (ISOVUE-300) INJECTION 61%
INTRAVENOUS | Status: AC
  Filled 2018-03-15: qty 100

## 2018-03-15 NOTE — ED Notes (Signed)
US at bedside, RN to chaperone.

## 2018-03-15 NOTE — ED Provider Notes (Signed)
[facility]-EMERGENCY DEPT Provider Note: Alexandra Dell, MD, FACEP  CSN: [id number] MRN: [id number] ARRIVAL: 03/14/18 at 2053 ROOM: WA20/WA20   CHIEF COMPLAINT  Abdominal Pain   HISTORY OF PRESENT ILLNESS  03/15/18 1:06 AM Alexandra Weber is a 43 y.o. female with a 4-day history of left lower quadrant abdominal pain.  She rates the pain as an 8 out of 10.  It is sharp and worse with movement or palpation.  The pain radiates to the left flank.  She denies associated vomiting or diarrhea but has had nausea.  She has had intermittent vaginal bleeding over the past 3 months, never as heavy as a regular period.   Past Medical History:  Diagnosis Date  . Asthma     Past Surgical History:  Procedure Laterality Date  . CESAREAN SECTION      History reviewed. No pertinent family history.  Social History   Tobacco Use  . Smoking status: Current Every Day Smoker  . Smokeless tobacco: Never Used  . Tobacco comment: states that she smokes 1pack per week  Substance Use Topics  . Alcohol use: No  . Drug use: Yes    Types: Marijuana    Comment: states that she quit 2 weeks ago    Prior to Admission medications   Medication Sig Start Date End Date Taking? Authorizing Provider  acetaminophen (TYLENOL) 500 MG tablet Take 1,000 mg by mouth every 6 (six) hours as needed for moderate pain.   Yes [provider]  doxycycline (VIBRAMYCIN) 100 MG capsule Take 1 capsule (100 mg total) by mouth 2 (two) times daily. Patient not taking: Reported on 03/15/2018 12/14/16   Hayden Rasmussen, NP  meloxicam (MOBIC) 15 MG tablet Take 1 tablet (15 mg total) by mouth daily. Patient not taking: Reported on 03/15/2018 06/01/17   Arthor Captain, PA-C  metroNIDAZOLE (FLAGYL) 500 MG tablet Take 1 tablet (500 mg total) by mouth 2 (two) times daily. X 7 days Patient not taking: Reported on 03/15/2018 12/14/16   Hayden Rasmussen, NP    Allergies Patient has no known allergies.   REVIEW OF SYSTEMS  Negative except as  noted here or in the History of Present Illness.   PHYSICAL EXAMINATION  Initial Vital Signs Blood pressure 117/83, pulse 86, temperature 97.6 F (36.4 C), temperature source Oral, resp. rate 16, SpO2 100 %.  Examination General: Well-developed, well-nourished female in no acute distress; appearance consistent with age of record HENT: normocephalic; atraumatic Eyes: pupils equal, round and reactive to light; extraocular muscles intact Neck: supple Heart: regular rate and rhythm Lungs: clear to auscultation bilaterally Abdomen: soft; nondistended; left lower quadrant tenderness; no masses or hepatosplenomegaly; bowel sounds present GU: Mild left CVA tenderness; vaginal bleeding; left adnexal tenderness Extremities: No deformity; full range of motion; pulses normal Neurologic: Awake, alert and oriented; motor function intact in all extremities and symmetric; no facial droop Skin: Warm and dry Psychiatric: Normal mood and affect   RESULTS  Summary of this visit's results, reviewed by myself:   EKG Interpretation  Date/Time:    Ventricular Rate:    PR Interval:    QRS Duration:   QT Interval:    QTC Calculation:   R Axis:     Text Interpretation:        Laboratory Studies: Results for orders placed or performed during the hospital encounter of 03/15/18 (from the past 24 hour(s))  Urinalysis, Routine w reflex microscopic     Status: Abnormal   Collection Time: 03/14/18  9:59 PM  Result Value Ref Range   Color, Urine YELLOW YELLOW   APPearance CLEAR CLEAR   Specific Gravity, Urine 1.011 1.005 - 1.030   pH 6.0 5.0 - 8.0   Glucose, UA NEGATIVE NEGATIVE mg/dL   Hgb urine dipstick LARGE (A) NEGATIVE   Bilirubin Urine NEGATIVE NEGATIVE   Ketones, ur NEGATIVE NEGATIVE mg/dL   Protein, ur NEGATIVE NEGATIVE mg/dL   Nitrite NEGATIVE NEGATIVE   Leukocytes, UA TRACE (A) NEGATIVE   RBC / HPF 0-5 0 - 5 RBC/hpf   WBC, UA 6-10 0 - 5 WBC/hpf   Bacteria, UA RARE (A) NONE SEEN    Squamous Epithelial / LPF 6-10 0 - 5  Lipase, blood     Status: None   Collection Time: 03/14/18 10:15 PM  Result Value Ref Range   Lipase 36 11 - 51 U/L  Comprehensive metabolic panel     Status: None   Collection Time: 03/14/18 10:15 PM  Result Value Ref Range   Sodium 138 135 - 145 mmol/L   Potassium 3.9 3.5 - 5.1 mmol/L   Chloride 103 98 - 111 mmol/L   CO2 26 22 - 32 mmol/L   Glucose, Bld 81 70 - 99 mg/dL   BUN 10 6 - 20 mg/dL   Creatinine, Ser 1.61 0.44 - 1.00 mg/dL   Calcium 9.2 8.9 - 09.6 mg/dL   Total Protein 7.4 6.5 - 8.1 g/dL   Albumin 4.1 3.5 - 5.0 g/dL   AST 24 15 - 41 U/L   ALT 21 0 - 44 U/L   Alkaline Phosphatase 63 38 - 126 U/L   Total Bilirubin 0.3 0.3 - 1.2 mg/dL   GFR calc non Af Amer >60 >60 mL/min   GFR calc Af Amer >60 >60 mL/min   Anion gap 9 5 - 15  CBC     Status: None   Collection Time: 03/14/18 10:15 PM  Result Value Ref Range   WBC 7.3 4.0 - 10.5 K/uL   RBC 3.92 3.87 - 5.11 MIL/uL   Hemoglobin 12.0 12.0 - 15.0 g/dL   HCT 04.5 40.9 - 81.1 %   MCV 97.7 80.0 - 100.0 fL   MCH 30.6 26.0 - 34.0 pg   MCHC 31.3 30.0 - 36.0 g/dL   RDW 91.4 78.2 - 95.6 %   Platelets 209 150 - 400 K/uL   nRBC 0.0 0.0 - 0.2 %  I-Stat beta hCG blood, ED     Status: None   Collection Time: 03/14/18 10:20 PM  Result Value Ref Range   I-stat hCG, quantitative <5.0 <5 mIU/mL   Comment 3           Imaging Studies: US Pelvis Transvanginal Non-ob (tv Only)  Result Date: 03/15/2018 CLINICAL DATA:  Left lower quadrant pain x1 week EXAM: TRANSABDOMINAL AND TRANSVAGINAL ULTRASOUND OF PELVIS DOPPLER ULTRASOUND OF OVARIES TECHNIQUE: Both transabdominal and transvaginal ultrasound examinations of the pelvis were performed. Transabdominal technique was performed for global imaging of the pelvis including uterus, ovaries, adnexal regions, and pelvic cul-de-sac. It was necessary to proceed with endovaginal exam following the transabdominal exam to visualize the bilateral ovaries. Color and  duplex Doppler ultrasound was utilized to evaluate blood flow to the ovaries. COMPARISON:  None. FINDINGS: Uterus Measurements: 9.3 x 4.8 x 4.8 cm = volume: 112 mL. No fibroids or other mass visualized. Endometrium Thickness: 5 mm.  No focal abnormality visualized. Right ovary Measurements: 2.2 x 1.4 x 2.4 cm = volume: 3.9 mL. Normal appearance/no adnexal mass. Left  ovary Measurements: 2.5 x 1.3 x 1.7 cm = volume: 2.9 mL. Normal appearance/no adnexal mass. Pulsed Doppler evaluation of both ovaries demonstrates normal low-resistance arterial and venous waveforms. Other findings No abnormal free fluid. IMPRESSION: Negative pelvic ultrasound. No evidence of ovarian torsion. Electronically Signed   By: Charline BillsSriyesh  Krishnan M.D.   On: 03/15/2018 03:19   [place] Pelvis Complete  Result Date: 03/15/2018 CLINICAL DATA:  Left lower quadrant pain x1 week EXAM: TRANSABDOMINAL AND TRANSVAGINAL ULTRASOUND OF PELVIS DOPPLER ULTRASOUND OF OVARIES TECHNIQUE: Both transabdominal and transvaginal ultrasound examinations of the pelvis were performed. Transabdominal technique was performed for global imaging of the pelvis including uterus, ovaries, adnexal regions, and pelvic cul-de-sac. It was necessary to proceed with endovaginal exam following the transabdominal exam to visualize the bilateral ovaries. Color and duplex Doppler ultrasound was utilized to evaluate blood flow to the ovaries. COMPARISON:  None. FINDINGS: Uterus Measurements: 9.3 x 4.8 x 4.8 cm = volume: 112 mL. No fibroids or other mass visualized. Endometrium Thickness: 5 mm.  No focal abnormality visualized. Right ovary Measurements: 2.2 x 1.4 x 2.4 cm = volume: 3.9 mL. Normal appearance/no adnexal mass. Left ovary Measurements: 2.5 x 1.3 x 1.7 cm = volume: 2.9 mL. Normal appearance/no adnexal mass. Pulsed Doppler evaluation of both ovaries demonstrates normal low-resistance arterial and venous waveforms. Other findings No abnormal free fluid. IMPRESSION: Negative pelvic  ultrasound. No evidence of ovarian torsion. Electronically Signed   By: Charline BillsSriyesh  Krishnan M.D.   On: 03/15/2018 03:19   Ct Abdomen Pelvis W Contrast  Result Date: 03/15/2018 CLINICAL DATA:  Left lower quadrant pain EXAM: CT ABDOMEN AND PELVIS WITH CONTRAST TECHNIQUE: Multidetector CT imaging of the abdomen and pelvis was performed using the standard protocol following bolus administration of intravenous contrast. CONTRAST:  100mL ISOVUE-300 IOPAMIDOL (ISOVUE-300) INJECTION 61% COMPARISON:  None. FINDINGS: Lower chest:  Negative. Hepatobiliary: No focal liver abnormality.No evidence of biliary obstruction or stone. Pancreas: Unremarkable. Spleen: Unremarkable. Adrenals/Urinary Tract: Negative adrenals. No hydronephrosis or stone. Unremarkable bladder. Stomach/Bowel:  No obstruction. No appendicitis. Vascular/Lymphatic: No acute vascular abnormality. No mass or adenopathy. Reproductive:No pathologic findings. Other: No ascites or pneumoperitoneum. Musculoskeletal: No acute abnormalities. IMPRESSION: Negative exam.  No explanation for abdominal pain. Electronically Signed   By: Marnee SpringJonathon  Watts M.D.   On: 03/15/2018 06:37   [place] Pelvic Doppler (torsion R/o Or Mass Arterial Flow)  Result Date: 03/15/2018 CLINICAL DATA:  Left lower quadrant pain x1 week EXAM: TRANSABDOMINAL AND TRANSVAGINAL ULTRASOUND OF PELVIS DOPPLER ULTRASOUND OF OVARIES TECHNIQUE: Both transabdominal and transvaginal ultrasound examinations of the pelvis were performed. Transabdominal technique was performed for global imaging of the pelvis including uterus, ovaries, adnexal regions, and pelvic cul-de-sac. It was necessary to proceed with endovaginal exam following the transabdominal exam to visualize the bilateral ovaries. Color and duplex Doppler ultrasound was utilized to evaluate blood flow to the ovaries. COMPARISON:  None. FINDINGS: Uterus Measurements: 9.3 x 4.8 x 4.8 cm = volume: 112 mL. No fibroids or other mass visualized. Endometrium  Thickness: 5 mm.  No focal abnormality visualized. Right ovary Measurements: 2.2 x 1.4 x 2.4 cm = volume: 3.9 mL. Normal appearance/no adnexal mass. Left ovary Measurements: 2.5 x 1.3 x 1.7 cm = volume: 2.9 mL. Normal appearance/no adnexal mass. Pulsed Doppler evaluation of both ovaries demonstrates normal low-resistance arterial and venous waveforms. Other findings No abnormal free fluid. IMPRESSION: Negative pelvic ultrasound. No evidence of ovarian torsion. Electronically Signed   By: Charline BillsSriyesh  Krishnan M.D.   On: 03/15/2018 03:19  ED COURSE and MDM  Nursing notes and initial vitals signs, including pulse oximetry, reviewed.  Vitals:   03/15/18 0200 03/15/18 0230 03/15/18 0300 03/15/18 0500  BP: 117/78 108/71 101/78 105/75  Pulse: (!) 56 68 85 73  Resp: 14 15 15 15   Temp:      TempSrc:      SpO2: 100% 97% 100% 98%   6:43 AM Patient advised of reassuring diagnostic studies.  We will treat with a brief course of analgesics but have her return if symptoms persist or worsen.  She was advised to follow-up with her OB/GYN regarding her irregular menses.  PROCEDURES    ED DIAGNOSES     ICD-10-CM   1. LLQ pain R10.32 US PELVIC DOPPLER (TORSION R/O OR MASS ARTERIAL FLOW)    US PELVIC DOPPLER (TORSION R/O OR MASS ARTERIAL FLOW)       Karelyn Brisby, MD 03/15/18 [phone]

## 2018-03-15 NOTE — ED Notes (Signed)
Patient transported to CT 

## 2018-10-19 IMAGING — CR DG ELBOW COMPLETE 3+V*R*
5 series · 5 of 5 positions shown · non-contrast
Comparison: None in PACs

ADDENDUM:
In the body of the report above I erroneously stated that there was
cortical irregularity involving the lateral epicondylar region. In
fact this should be the medial epicondylar region. I apologize for
the confusion that this has caused.
CLINICAL DATA: The patient slipped and fell in a parking lot
yesterday landing on the right elbow and right shoulder. Persistent
pain in these regions.

EXAM:
RIGHT ELBOW - COMPLETE 3+ VIEW

[x elbow ap right]
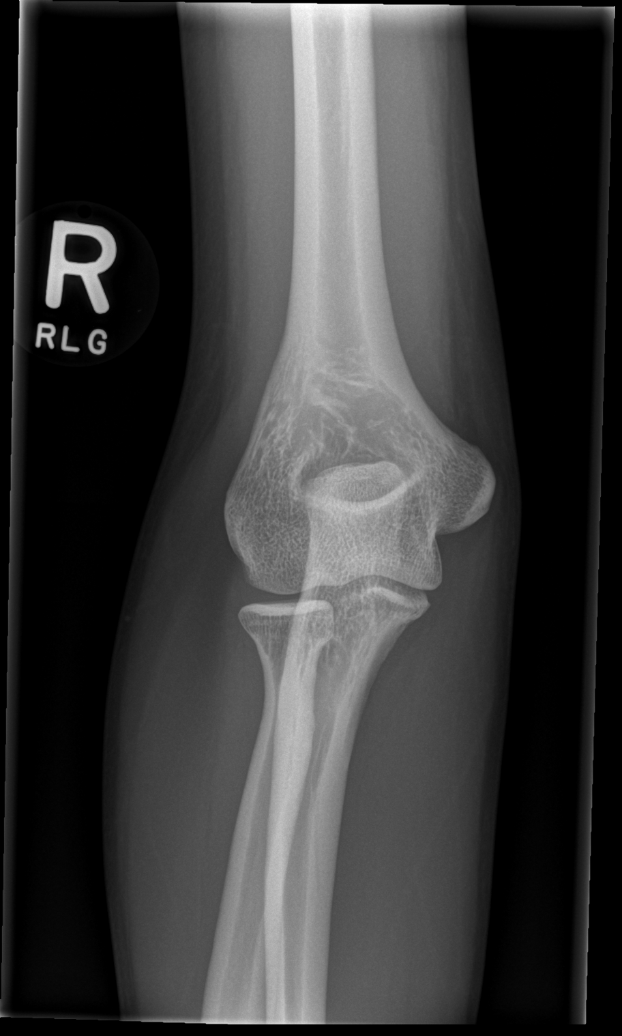

[x elbow obl right (1 of 2)]
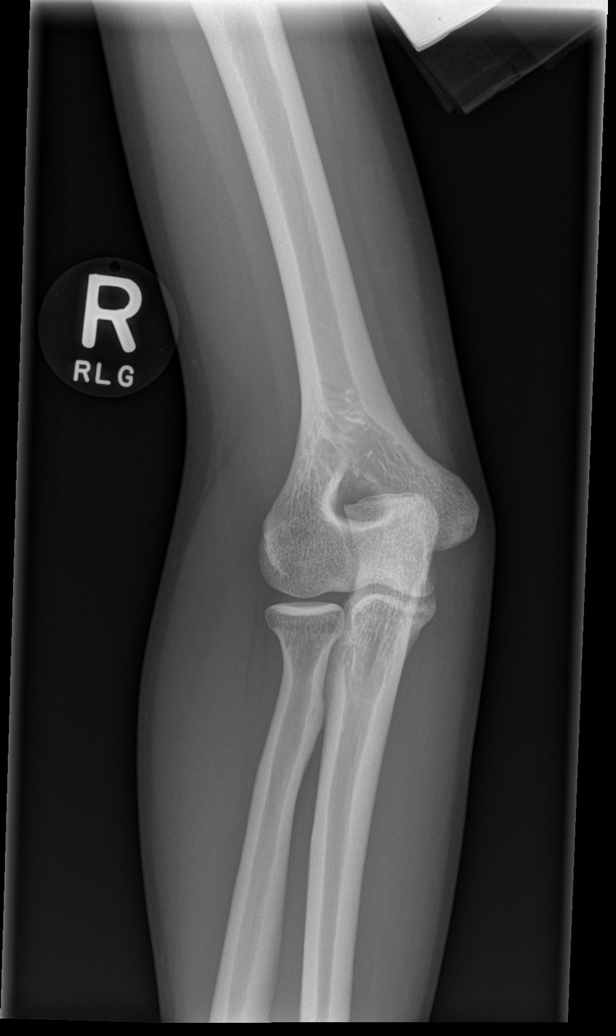

[x elbow obl right (2 of 2)]
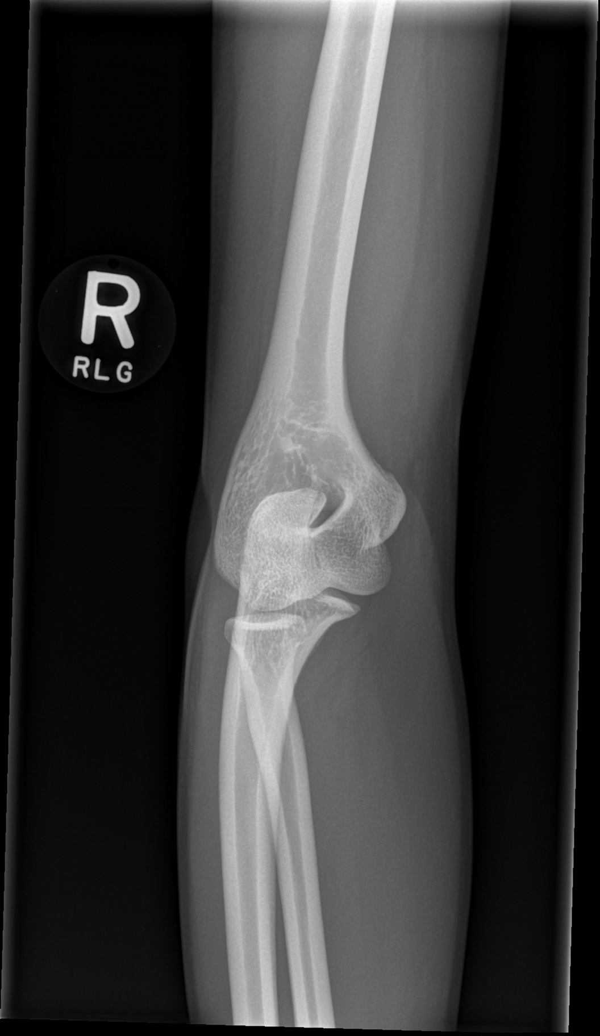

[x elbow lat right (1 of 2)]
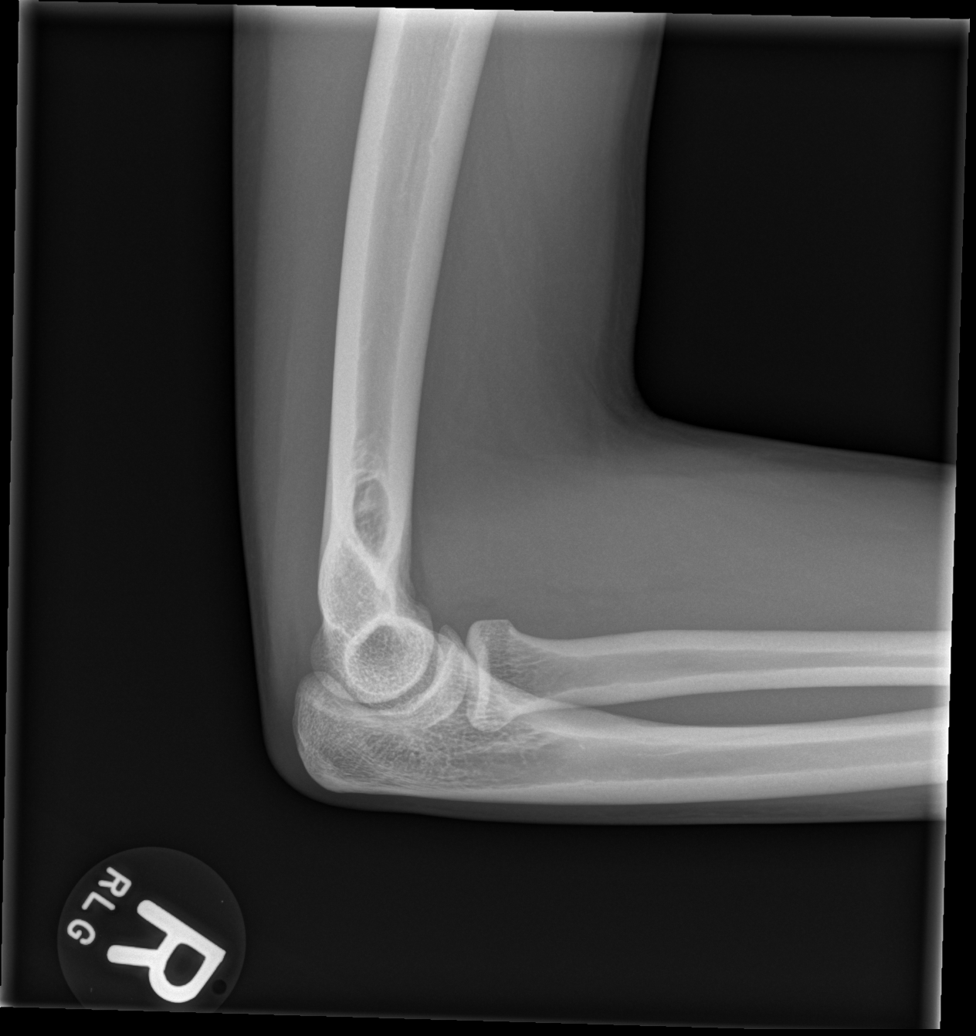

[x elbow lat right (2 of 2)]
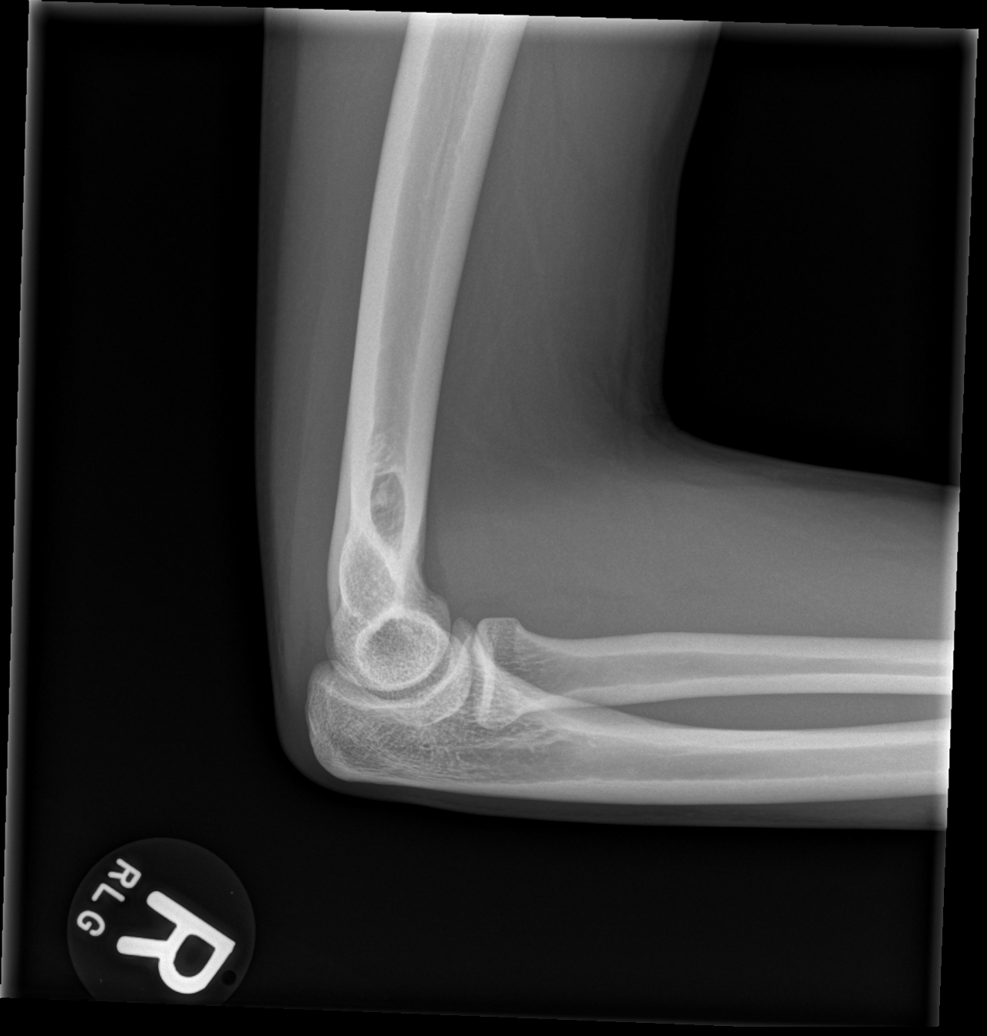

[5 of 5 positions shown; findings below may reference images not displayed]

FINDINGS: The bones are subjectively adequately mineralized. The radial head
and the olecranon are intact. There is subtle irregularity of the
lateral epicondylar region but no surrounding infusion. The medial
condyles and epicondylar regions are normal.
IMPRESSION: No definite acute abnormality of the elbow. Subtle irregularity over
the medial epicondylar region is noted. Correlation with any
symptoms in this region would be useful.

## 2018-11-07 ENCOUNTER — Emergency Department (HOSPITAL_COMMUNITY)
Admission: EM | Admit: 2018-11-07 | Discharge: 2018-11-07 | Disposition: A | Discharge status: Home / Self Care | Payer: Self-pay | Attending: Emergency Medicine | Admitting: Emergency Medicine

## 2018-11-07 ENCOUNTER — Other Ambulatory Visit: Payer: Self-pay

## 2018-11-07 ENCOUNTER — Encounter (HOSPITAL_COMMUNITY): Payer: Self-pay

## 2018-11-07 DIAGNOSIS — Z5321 Procedure and treatment not carried out due to patient leaving prior to being seen by health care provider: Secondary | ICD-10-CM | POA: Insufficient documentation

## 2018-11-07 DIAGNOSIS — M542 Cervicalgia: Secondary | ICD-10-CM | POA: Insufficient documentation

## 2018-11-07 NOTE — ED Triage Notes (Signed)
Patient states she woke with pain of the left lateral neck and could not turn her head. patient states the pain has since moved into her left scapula area and left chest as well.

## 2019-09-11 ENCOUNTER — Emergency Department (HOSPITAL_COMMUNITY)
Admission: EM | Admit: 2019-09-11 | Discharge: 2019-09-12 | Disposition: A | Discharge status: Home / Self Care | Payer: Medicaid Other | Attending: Emergency Medicine | Admitting: Emergency Medicine

## 2019-09-11 ENCOUNTER — Other Ambulatory Visit: Payer: Self-pay

## 2019-09-11 ENCOUNTER — Inpatient Hospital Stay (HOSPITAL_COMMUNITY)
Admission: AD | Admit: 2019-09-11 | Discharge: 2019-09-11 | Disposition: A | Discharge status: Home / Self Care | Payer: Medicaid Other | Source: Home / Self Care | Attending: Obstetrics & Gynecology | Admitting: Obstetrics & Gynecology

## 2019-09-11 DIAGNOSIS — Z5321 Procedure and treatment not carried out due to patient leaving prior to being seen by health care provider: Secondary | ICD-10-CM | POA: Diagnosis not present

## 2019-09-11 DIAGNOSIS — N939 Abnormal uterine and vaginal bleeding, unspecified: Secondary | ICD-10-CM

## 2019-09-11 DIAGNOSIS — R519 Headache, unspecified: Secondary | ICD-10-CM | POA: Diagnosis not present

## 2019-09-11 DIAGNOSIS — R109 Unspecified abdominal pain: Secondary | ICD-10-CM

## 2019-09-11 DIAGNOSIS — R110 Nausea: Secondary | ICD-10-CM | POA: Insufficient documentation

## 2019-09-11 LAB — COMPREHENSIVE METABOLIC PANEL
ALT: 11 U/L (ref 0–44)
AST: 15 U/L (ref 15–41)
Albumin: 4.6 g/dL (ref 3.5–5.0)
Alkaline Phosphatase: 67 U/L (ref 38–126)
Anion gap: 9 (ref 5–15)
BUN: 10 mg/dL (ref 6–20)
CO2: 25 mmol/L (ref 22–32)
Calcium: 9.2 mg/dL (ref 8.9–10.3)
Chloride: 105 mmol/L (ref 98–111)
Creatinine, Ser: 0.73 mg/dL (ref 0.44–1.00)
GFR calc Af Amer: >60 mL/min (ref >60)
GFR calc non Af Amer: >60 mL/min (ref >60)
Glucose, Bld: 77 mg/dL (ref 70–99)
Potassium: 3.9 mmol/L (ref 3.5–5.1)
Sodium: 139 mmol/L (ref 135–145)
Total Bilirubin: 0.8 mg/dL (ref 0.3–1.2)
Total Protein: 7.8 g/dL (ref 6.5–8.1)

## 2019-09-11 LAB — CBC
HCT: 38.8 % (ref 36.0–46.0)
Hemoglobin: 12.5 g/dL (ref 12.0–15.0)
MCH: 30.5 pg (ref 26.0–34.0)
MCHC: 32.2 g/dL (ref 30.0–36.0)
MCV: 94.6 fL (ref 80.0–100.0)
Platelets: 243 10*3/uL (ref 150–400)
RBC: 4.1 MIL/uL (ref 3.87–5.11)
RDW: 13 % (ref 11.5–15.5)
WBC: 9.3 10*3/uL (ref 4.0–10.5)
nRBC: 0 % (ref 0.0–0.2)

## 2019-09-11 LAB — LIPASE, BLOOD: Lipase: 23 U/L (ref 11–51)

## 2019-09-11 LAB — I-STAT BETA HCG BLOOD, ED (MC, WL, AP ONLY): I-stat hCG, quantitative: <5 m[IU]/mL (ref <5)

## 2019-09-11 MED ORDER — SODIUM CHLORIDE 0.9% FLUSH: 3.0000 mL | Freq: Once | INTRAVENOUS | Status: DC

## 2019-09-11 NOTE — ED Triage Notes (Signed)
Patient reports to the ER for nausea, abdominal cramping and passing clots. Patinet also reports a headache.

## 2019-09-11 NOTE — MAU Provider Note (Signed)
   S Ms. Alexandra Weber is a 44 y.o. No obstetric history on file. non-pregnant female who presents to MAU today with complaint of lower abdominal pain and irregular bleeding.  [facility] needed to [facility] an GYN to be seen for "uterus bleeding". . No shortness of breath, chest pain or heavy bleeding.  Clinically stable  RN Note: My menstrual cycle was not normal in June. Had bleeding last Fri. Sunday had a lot of cramping and passed clot in toilet. Then bleeding stopped but cramping continued. Since Sunday I have had a bad headache with nausea and cramping in lower back and abd. Now only having brown d/c  O BP 134/90 (BP Location: Right Arm)   Pulse 90   Temp 98.3 F (36.8 C)   Ht 5\' 4"  (1.626 m)   Wt 53.5 kg   LMP 09/05/2019   BMI 20.25 kg/m  Physical Exam Constitutional:      Appearance: She is well-developed.  HENT:     Head: Normocephalic.  Cardiovascular:     Rate and Rhythm: Normal rate.  Pulmonary:     Effort: Pulmonary effort is normal.  Skin:    General: Skin is warm and dry.  Neurological:     Mental Status: She is alert.     A Non pregnant female Medical screening exam complete Abdominal pain and AUB  P Discharge from MAU in stable condition Patient given the option of transfer to [facility] for further evaluation or seek care in outpatient facility of choice List of options for follow-up given   I wrote down number and address for clinic to arrange for followup . Message sent to office   Warning signs for worsening condition that would warrant emergency follow-up discussed Patient may return to MAU as needed for pregnancy related complaints  [facility], CNM 09/11/2019 9:44 PM

## 2019-09-11 NOTE — MAU Note (Signed)
My menstrual cycle was not normal in June. Had bleeding last Fri. Sunday had a lot of cramping and passed clot in toilet. Then bleeding stopped but cramping continued. Since Sunday I have had a bad headache with nausea and cramping in lower back and abd. Now only having brown d/c

## 2019-09-11 NOTE — MAU Note (Addendum)
Wynelle Bourgeois CNM in Triage to see pt. POC discussed and pt then d/c home from Triage.

## 2019-10-03 ENCOUNTER — Encounter: Payer: Medicaid Other | Admitting: Obstetrics and Gynecology

## 2019-10-03 ENCOUNTER — Encounter: Payer: Self-pay | Admitting: Obstetrics and Gynecology

## 2019-10-05 NOTE — Progress Notes (Signed)
Patient did not keep her GYN referral appointment for 10/03/2019.  Liv Rallis, Jr MD Attending [facility] (Faculty Practice)   

## 2023-12-12 ENCOUNTER — Emergency Department (HOSPITAL_BASED_OUTPATIENT_CLINIC_OR_DEPARTMENT_OTHER)

## 2023-12-12 ENCOUNTER — Other Ambulatory Visit: Payer: Self-pay

## 2023-12-12 ENCOUNTER — Emergency Department (HOSPITAL_BASED_OUTPATIENT_CLINIC_OR_DEPARTMENT_OTHER)
Admission: EM | Admit: 2023-12-12 | Discharge: 2023-12-12 | Disposition: A | Discharge status: Home / Self Care | Attending: Emergency Medicine | Admitting: Emergency Medicine

## 2023-12-12 ENCOUNTER — Encounter (HOSPITAL_BASED_OUTPATIENT_CLINIC_OR_DEPARTMENT_OTHER): Payer: Self-pay | Admitting: *Deleted

## 2023-12-12 DIAGNOSIS — M25472 Effusion, left ankle: Secondary | ICD-10-CM | POA: Insufficient documentation

## 2023-12-12 NOTE — ED Provider Notes (Signed)
 [place] EMERGENCY DEPARTMENT AT [facility] Provider Note   CSN: [id number] Arrival date & time: 12/12/23  2025     Patient presents with: Joint Swelling and Headache   Alexandra Weber is a 48 y.o. female. 48 year old female presents to ED with complaints of left ankle swelling for 1 week.  Patient denies any traumatic injuries or change of activity/medication.  Patient has attempted to prop it up and soak in warm bath without relief.  Patient denies any history of gout or DVT/PE.  Patient has no associated shortness of breath, dizziness, or chest pain.   Patient has recently got over a mild cough with associated sore throat.  Patient reports symptoms have almost completely resolved.  Patient also has history of chronic migraines and reports he has had several mild headaches this week which have resolved with Excedrin at home.     Prior to Admission medications   Medication Sig Start Date End Date Taking? Authorizing Provider  acetaminophen  (TYLENOL ) 500 MG tablet Take 1,000 mg by mouth every 6 (six) hours as needed for moderate pain.    [provider]  HYDROcodone -acetaminophen  (NORCO) 5-325 MG tablet Take 1 tablet by mouth every 4 (four) hours as needed (for pain). 03/15/18   Molpus, Norleen, MD    Allergies: Patient has no known allergies.    Review of Systems  Musculoskeletal:  Positive for joint swelling.  Neurological:  Positive for headaches.  All other systems reviewed and are negative.   Updated Vital Signs BP (!) 117/90   Pulse 77   Temp 97.6 F (36.4 C) (Temporal)   Resp 16   SpO2 99%   Physical Exam Vitals and nursing note reviewed.  Constitutional:      Appearance: Normal appearance. She is well-developed.  HENT:     Head: Normocephalic and atraumatic.     Nose: Nose normal.  Eyes:     Extraocular Movements: Extraocular movements intact.     Conjunctiva/sclera: Conjunctivae normal.     Pupils: Pupils are equal, round, and reactive  to light.  Cardiovascular:     Rate and Rhythm: Normal rate.  Pulmonary:     Effort: Pulmonary effort is normal. No respiratory distress.     Breath sounds: Normal breath sounds.  Musculoskeletal:        General: Swelling present. No tenderness. Normal range of motion.     Cervical back: Normal range of motion.  Skin:    General: Skin is warm.     Capillary Refill: Capillary refill takes less than 2 seconds.  Neurological:     General: No focal deficit present.     Mental Status: She is alert.     Cranial Nerves: No cranial nerve deficit.     Sensory: No sensory deficit.     Motor: No weakness.  Psychiatric:        Mood and Affect: Mood normal.        Behavior: Behavior normal.     (all labs ordered are listed, but only abnormal results are displayed) Labs Reviewed - No data to display  EKG: None  Radiology: DG Ankle Complete Left Result Date: 12/12/2023 EXAM: 3 OR MORE VIEW(S) XRAY OF THE LEFT ANKLE 12/12/2023 09:51:00 PM CLINICAL HISTORY: Pain and swelling atraumatic. Pt to ED reporting 1 week of intermittent headaches and left ankle swelling without known injury. Pt also reporting her voice has been hoarse this week. No hx of Gout or CHF. NO shortness of breath. COMPARISON: None available. FINDINGS: BONES  AND JOINTS: No acute fracture. No focal osseous lesion. No joint dislocation. SOFT TISSUES: There is soft tissue swelling of the ankle and lower extremity. IMPRESSION: 1. Soft tissue swelling of the left ankle and lower extremity. Electronically signed by: Greig Pique MD 12/12/2023 09:54 PM EDT RP Workstation: [id number]    Procedures   Medications Ordered in the ED - No data to display      48 y.o. female presents to the ED with complaints of left ankle swelling x 1 week, this involves an extensive number of treatment options, and is a complaint that carries with it a high risk of complications and morbidity.  The differential diagnosis includes gout, CHF, septic  joint, dependent edema, venous insufficiency,DVT (Ddx)  On arrival pt is nontoxic, vitals unremarkable. Exam significant for mild left ankle and foot swelling  Imaging Studies ordered:  I ordered imaging studies which included x-ray of left ankle, I independently visualized and interpreted imaging which showed soft tissue swelling no bony abnormality  ED Course:   48 year old female presents to ED with complaints of left ankle swelling.  Symptoms have been going on for 1 week without relief from soaking in warm baths or elevation.  Patient is sitting comfortably in ED bed in no acute distress.  Patient has some mild swelling on the left ankle and foot.  No significant pitting noted on exam.  There is no redness, warmth, or focal joint swelling.  No signs of cellulitis.  Patient does not have any history of gout.  Patient has not had any associated shortness of breath, orthopnea, or history of CHF.  Right ankle is unremarkable.  Patient has no pain with range of motion on the left ankle, full sensation, and good cap refill.  Patient had no recent injuries or trauma and has no pain with range of motion.  Knee has no acute swelling, redness, warmth.  Calf is unremarkable as well.  DVT is not clinically suspected currently.  Patient's Wells score is 0. Lungs were clear to auscultation in all fields and there were no noted rashes.  Patient reports she has had a few headaches in the last couple weeks, patient has significant history of migraines.  Patient reports headaches were controlled with Excedrin Migraine over-the-counter.  Patient had associated illness which is almost fully resolved where she had associated cough and sore throat.  Throat is not erythematous with no exudates and cough is nonproductive.  X-ray was remarkable for soft tissue swelling without any bony abnormality.  It was advised the patient to trial ibuprofen at home with continued elevation.  Patient was given orthopedic referral if  symptoms do not resolve.  Patient agreed with treatment plan and was comfortable discharge.   Portions of this note were generated with Scientist, clinical (histocompatibility and immunogenetics). Dictation errors may occur despite best attempts at proofreading.  Final diagnoses:  Left ankle swelling    ED Discharge Orders     None          Myriam Fonda GORMAN DEVONNA 12/14/23 [id number]    Randol Simmonds, MD 12/15/23 0900

## 2023-12-12 NOTE — ED Triage Notes (Signed)
 Pt to ED reporting 1 week of intermittent headaches and left ankle swelling without known injury. Pt also reporting her voice has been hoarse this week.   No hx of Gout or CHF. NO shortness of breath

## 2023-12-12 NOTE — Discharge Instructions (Addendum)
 X-ray and exam were reassuring today.  Please use ibuprofen as needed for relief of swelling and pain.  I have attached a referral for orthopedics, follow-up with them if symptoms do not resolve after trial of ibuprofen.  If symptoms worsen or concerning symptoms arise please return to ED for further evaluation.  Some of the symptoms include severe swelling, redness, warmth, or swelling that progresses up the leg.

## 2023-12-12 NOTE — ED Notes (Signed)
 Pt d/c instructions, medications, and follow-up care reviewed with pt. Pt verbalized understanding and had no further questions at time of d/c. Pt CA&Ox4, ambulatory, and in NAD at time of d/c

## 2024-03-17 ENCOUNTER — Other Ambulatory Visit (HOSPITAL_BASED_OUTPATIENT_CLINIC_OR_DEPARTMENT_OTHER): Payer: Self-pay
